# Patient Record
Sex: Female | Born: 1995 | Race: Black or African American | Hispanic: No | Marital: Single | Smoking: Never smoker
Health system: Southern US, Community
[De-identification: ages and names within clinical notes are randomized; demographics above are authoritative.]

## PROBLEM LIST (undated history)

## (undated) DIAGNOSIS — R002 Palpitations: Secondary | ICD-10-CM

## (undated) DIAGNOSIS — J45909 Unspecified asthma, uncomplicated: Secondary | ICD-10-CM

## (undated) DIAGNOSIS — T7840XA Allergy, unspecified, initial encounter: Secondary | ICD-10-CM

## (undated) HISTORY — DX: Unspecified asthma, uncomplicated: J45.909

## (undated) HISTORY — DX: Allergy, unspecified, initial encounter: T78.40XA

---

## 2017-02-02 ENCOUNTER — Encounter: Payer: Self-pay | Admitting: Physician Assistant

## 2017-02-02 ENCOUNTER — Ambulatory Visit (INDEPENDENT_AMBULATORY_CARE_PROVIDER_SITE_OTHER): Payer: Medicaid Other | Admitting: Physician Assistant

## 2017-02-02 VITALS — BP 116/76 | HR 93 | Temp 98.4°F | Ht 64.25 in | Wt 155.0 lb

## 2017-02-02 DIAGNOSIS — Z23 Encounter for immunization: Secondary | ICD-10-CM

## 2017-02-02 DIAGNOSIS — J4530 Mild persistent asthma, uncomplicated: Secondary | ICD-10-CM | POA: Diagnosis not present

## 2017-02-02 MED ORDER — ALBUTEROL SULFATE HFA 108 (90 BASE) MCG/ACT IN AERS: 2.0000 | INHALATION_SPRAY | Freq: Four times a day (QID) | RESPIRATORY_TRACT | 5 refills | Status: DC | PRN

## 2017-02-02 MED ORDER — BECLOMETHASONE DIPROPIONATE 40 MCG/ACT IN AERS: 1.0000 | INHALATION_SPRAY | Freq: Two times a day (BID) | RESPIRATORY_TRACT | 11 refills | Status: DC

## 2017-02-02 MED ORDER — CETIRIZINE HCL 10 MG PO TABS: 10.0000 mg | ORAL_TABLET | Freq: Every day | ORAL | 3 refills | Status: DC

## 2017-02-02 MED ORDER — ALBUTEROL SULFATE (2.5 MG/3ML) 0.083% IN NEBU: 2.5000 mg | INHALATION_SOLUTION | Freq: Four times a day (QID) | RESPIRATORY_TRACT | 5 refills | Status: DC | PRN

## 2017-02-02 NOTE — Patient Instructions (Signed)
It was great meeting you today!  I have sent in refills for your medication. Please let us know if your asthma worsens and make an appointment so we can alter your medications.   Asthma, Adult Asthma is a recurring condition in which the airways tighten and narrow. Asthma can make it difficult to breathe. It can cause coughing, wheezing, and shortness of breath. Asthma episodes, also called asthma attacks, range from minor to life-threatening. Asthma cannot be cured, but medicines and lifestyle changes can help control it. What are the causes? Asthma is believed to be caused by inherited (genetic) and environmental factors, but its exact cause is unknown. Asthma may be triggered by allergens, lung infections, or irritants in the air. Asthma triggers are different for each person. Common triggers include:  Animal dander.  Dust mites.  Cockroaches.  Pollen from trees or grass.  Mold.  Smoke.  Air pollutants such as dust, household cleaners, hair sprays, aerosol sprays, paint fumes, strong chemicals, or strong odors.  Cold air, weather changes, and winds (which increase molds and pollens in the air).  Strong emotional expressions such as crying or laughing hard.  Stress.  Certain medicines (such as aspirin) or types of drugs (such as beta-blockers).  Sulfites in foods and drinks. Foods and drinks that may contain sulfites include dried fruit, potato chips, and sparkling grape juice.  Infections or inflammatory conditions such as the flu, a cold, or an inflammation of the nasal membranes (rhinitis).  Gastroesophageal reflux disease (GERD).  Exercise or strenuous activity. What are the signs or symptoms? Symptoms may occur immediately after asthma is triggered or many hours later. Symptoms include:  Wheezing.  Excessive nighttime or early morning coughing.  Frequent or severe coughing with a common cold.  Chest tightness.  Shortness of breath. How is this diagnosed? The  diagnosis of asthma is made by a review of your medical history and a physical exam. Tests may also be performed. These may include:  Lung function studies. These tests show how much air you breathe in and out.  Allergy tests.  Imaging tests such as X-rays. How is this treated? Asthma cannot be cured, but it can usually be controlled. Treatment involves identifying and avoiding your asthma triggers. It also involves medicines. There are 2 classes of medicine used for asthma treatment:  Controller medicines. These prevent asthma symptoms from occurring. They are usually taken every day.  Reliever or rescue medicines. These quickly relieve asthma symptoms. They are used as needed and provide short-term relief. Your health care provider will help you create an asthma action plan. An asthma action plan is a written plan for managing and treating your asthma attacks. It includes a list of your asthma triggers and how they may be avoided. It also includes information on when medicines should be taken and when their dosage should be changed. An action plan may also involve the use of a device called a peak flow meter. A peak flow meter measures how well the lungs are working. It helps you monitor your condition. Follow these instructions at home:  Take medicines only as directed by your health care provider. Speak with your health care provider if you have questions about how or when to take the medicines.  Use a peak flow meter as directed by your health care provider. Record and keep track of readings.  Understand and use the action plan to help minimize or stop an asthma attack without needing to seek medical care.  Control your home  environment in the following ways to help prevent asthma attacks:  Do not smoke. Avoid being exposed to secondhand smoke.  Change your heating and air conditioning filter regularly.  Limit your use of fireplaces and wood stoves.  Get rid of pests (such as  roaches and mice) and their droppings.  Throw away plants if you see mold on them.  Clean your floors and dust regularly. Use unscented cleaning products.  Try to have someone else vacuum for you regularly. Stay out of rooms while they are being vacuumed and for a short while afterward. If you vacuum, use a dust mask from a hardware store, a double-layered or microfilter vacuum cleaner bag, or a vacuum cleaner with a HEPA filter.  Replace carpet with wood, tile, or vinyl flooring. Carpet can trap dander and dust.  Use allergy-proof pillows, mattress covers, and box spring covers.  Wash bed sheets and blankets every week in hot water and dry them in a dryer.  Use blankets that are made of polyester or cotton.  Clean bathrooms and kitchens with bleach. If possible, have someone repaint the walls in these rooms with mold-resistant paint. Keep out of the rooms that are being cleaned and painted.  Wash hands frequently. Contact a health care provider if:  You have wheezing, shortness of breath, or a cough even if taking medicine to prevent attacks.  The colored mucus you cough up (sputum) is thicker than usual.  Your sputum changes from clear or white to yellow, green, gray, or bloody.  You have any problems that may be related to the medicines you are taking (such as a rash, itching, swelling, or trouble breathing).  You are using a reliever medicine more than 2-3 times per week.  Your peak flow is still at 50-79% of your personal best after following your action plan for 1 hour.  You have a fever. Get help right away if:  You seem to be getting worse and are unresponsive to treatment during an asthma attack.  You are short of breath even at rest.  You get short of breath when doing very little physical activity.  You have difficulty eating, drinking, or talking due to asthma symptoms.  You develop chest pain.  You develop a fast heartbeat.  You have a bluish color to your  lips or fingernails.  You are light-headed, dizzy, or faint.  Your peak flow is less than 50% of your personal best. This information is not intended to replace advice given to you by your health care provider. Make sure you discuss any questions you have with your health care provider.

## 2017-02-02 NOTE — Progress Notes (Signed)
Pre visit review using our clinic review tool, if applicable. No additional management support is needed unless otherwise documented below in the visit note. 

## 2017-02-02 NOTE — Progress Notes (Signed)
   Subjective:    Patient ID: Nancy PigeonMonique Gay, female    DOB: 07-07-1996, 21 y.o.   MRN: [id number]  HPI  Nancy Gay is a 21 y/o female who presents to clinic today to establish care.  Acute Concerns: none  Chronic Issues: Asthma -- last flare-up was last month with activity while working out, she is currently out of her albuterol inhaler so she limits her activity purposefully; her asthma is "worse at night"  Health Maintenance: Immunizations -- did not get the flu shot this year Diet -- variable, just started working on more variety, cut out sodas Exercise -- occasional  Weight -- Weight: 155 lb (70.3 kg) , usually 147lb Mood -- no concerns for depression  Other providers/specialists: None  Review of Systems  See HPI  Past Medical History:  Diagnosis Date  . Allergy    dust mites, animal fur  . Asthma      Social History   Social History  . Marital status: Single    Spouse name: N/A  . Number of children: N/A  . Years of education: N/A   Occupational History  . Not on file.   Social History Main Topics  . Smoking status: Never Smoker  . Smokeless tobacco: Never Used  . Alcohol use No  . Drug use: No  . Sexual activity: No   Other Topics Concern  . Not on file   Social History Narrative   Goes by Nancy Gay   [employer] -- [employer], graduates in 2019   Lives on campus   Hobbies: reading books, watching Netflix and cartoons    History reviewed. No pertinent surgical history.  Family History  Problem Relation Age of Onset  . Hypertension Mother   . Asthma Sister   . Asthma Brother     Allergies  Allergen Reactions  . Amoxicillin Shortness Of Breath  . Penicillins Shortness Of Breath    No current outpatient prescriptions on file prior to visit.   No current facility-administered medications on file prior to visit.     BP 116/76 (BP Location: Left Arm, Patient Position: Sitting, Cuff Size: Normal)   Pulse 93   Temp 98.4 F (36.9  C) (Oral)   Ht 5' 4.25" (1.632 m)   Wt 155 lb (70.3 kg)   LMP 01/27/2017 (Exact Date)   SpO2 98%   BMI 26.40 kg/m      Objective:   Physical Exam  Constitutional: She appears well-developed and well-nourished. She is cooperative.  Non-toxic appearance. She does not have a sickly appearance. She does not appear ill. No distress.  HENT:  Head: Normocephalic and atraumatic.  Cardiovascular: Normal rate, regular rhythm and normal heart sounds.   Pulmonary/Chest: Effort normal and breath sounds normal. No accessory muscle usage. No respiratory distress.  Lymphadenopathy:    She has no cervical adenopathy.  Neurological: She is alert.  Nursing note and vitals reviewed.      Assessment & Plan:  1. Encounter for immunization - Flu Vaccine QUAD 36+ mos IM  2. Mild persistent asthma without complication She is currently limiting her activity secondary to not having an inhaler on hand, she has been on maintenance inhalers in the past, such as QVAR. I have started her back on this, continue daily singulair and albuterol prn. She is to let us know if her asthma symptoms worsen.  Jarold MottoSamantha Pierre Cumpton PA-C 02/02/17

## 2017-07-03 ENCOUNTER — Encounter: Payer: Self-pay | Admitting: Physician Assistant

## 2017-07-03 NOTE — Telephone Encounter (Signed)
Please see message regarding changing Inhaler Qvar to Flovent due to insurance. Please advise.

## 2017-08-02 ENCOUNTER — Encounter: Payer: Self-pay | Admitting: Physician Assistant

## 2017-08-02 ENCOUNTER — Telehealth: Payer: Self-pay | Admitting: Physician Assistant

## 2017-08-02 ENCOUNTER — Other Ambulatory Visit: Payer: Self-pay | Admitting: Physician Assistant

## 2017-08-02 MED ORDER — MONTELUKAST SODIUM 10 MG PO TABS: 10.0000 mg | ORAL_TABLET | Freq: Every day | ORAL | 2 refills | Status: DC

## 2017-08-02 MED ORDER — BECLOMETHASONE DIPROPIONATE 40 MCG/ACT IN AERS: 1.0000 | INHALATION_SPRAY | Freq: Two times a day (BID) | RESPIRATORY_TRACT | 5 refills | Status: DC

## 2017-08-02 NOTE — Telephone Encounter (Signed)
Patient called in reference to Beclomethasone. Patient stated she would like to go ahead and have that sent to pharmacy. Please call patient with any questions.    [facility] [phone] [place] [address] [facility] [address] [phone] (Phone) [phone] (Fax)

## 2017-08-02 NOTE — Telephone Encounter (Signed)
Samantha, okay to refill Singulair?

## 2017-08-02 NOTE — Telephone Encounter (Signed)
Left message on voicemail to call office. Rx for Qvar was sent to pharmacy.

## 2017-08-06 ENCOUNTER — Encounter: Payer: Self-pay | Admitting: Physician Assistant

## 2017-08-06 ENCOUNTER — Other Ambulatory Visit: Payer: Self-pay | Admitting: Physician Assistant

## 2017-08-06 MED ORDER — FLUTICASONE PROPIONATE HFA 110 MCG/ACT IN AERO: 1.0000 | INHALATION_SPRAY | Freq: Two times a day (BID) | RESPIRATORY_TRACT | 3 refills | Status: DC

## 2017-08-30 ENCOUNTER — Encounter (HOSPITAL_COMMUNITY): Payer: Self-pay | Admitting: Emergency Medicine

## 2017-08-30 ENCOUNTER — Emergency Department (HOSPITAL_COMMUNITY)
Admission: EM | Admit: 2017-08-30 | Discharge: 2017-08-30 | Disposition: A | Discharge status: Home / Self Care | Payer: Medicaid Other | Attending: Emergency Medicine | Admitting: Emergency Medicine

## 2017-08-30 ENCOUNTER — Emergency Department (HOSPITAL_COMMUNITY): Payer: Medicaid Other

## 2017-08-30 DIAGNOSIS — J4521 Mild intermittent asthma with (acute) exacerbation: Secondary | ICD-10-CM

## 2017-08-30 DIAGNOSIS — R0602 Shortness of breath: Secondary | ICD-10-CM | POA: Diagnosis present

## 2017-08-30 MED ORDER — ALBUTEROL SULFATE (2.5 MG/3ML) 0.083% IN NEBU
5.0000 mg | INHALATION_SOLUTION | Freq: Once | RESPIRATORY_TRACT | Status: AC
  Administered 2017-08-30: 5 mg via RESPIRATORY_TRACT

## 2017-08-30 MED ORDER — ALBUTEROL SULFATE (2.5 MG/3ML) 0.083% IN NEBU
INHALATION_SOLUTION | RESPIRATORY_TRACT | Status: AC
  Filled 2017-08-30: qty 6

## 2017-08-30 MED ORDER — PREDNISONE 20 MG PO TABS: 40.0000 mg | ORAL_TABLET | Freq: Every day | ORAL | 0 refills | Status: DC

## 2017-08-30 NOTE — ED Notes (Signed)
Pt reports asthma exacerbation last night, attempted inhaler at home with no relief. Mild wheezing noted, RR E/U, no distress.

## 2017-08-30 NOTE — Discharge Instructions (Signed)
1. Medications: albuterol, prednisone, usual home medications °2. Treatment: rest, drink plenty of fluids, begin OTC antihistamine (Zyrtec or Claritin)  °3. Follow Up: Please followup with your primary doctor in 2-3 days for discussion of your diagnoses and further evaluation after today's visit; if you do not have a primary care doctor use the resource guide provided to find one; Please return to the ER for difficulty breathing, high fevers or worsening symptoms. ° °

## 2017-08-30 NOTE — ED Provider Notes (Signed)
[facility]-EMERGENCY DEPT Provider Note   CSN: [id number] Arrival date & time: 08/30/17  0436     History   Chief Complaint Chief Complaint  Patient presents with  . Asthma    HPI Nancy Gay is a 21 y.o. female with a hx of Asthma, allergies presents to the Emergency Department complaining of acute shortness of breath waking her from sleep around 2am with shortness of breath and wheezing. Patient reports she used her rescue inhaler without complete relief, thus prompting her visit to the emergency department 9. Patient reports she primarily uses her rescue inhaler with exercise, proximally 4 times per week.  Patient reports that she is taking Zyrtec, Flovent and Singulair daily. She reports she missed her Zyrtec and Singulair this evening. Patient states she does not regularly wake at night with wheezing or shortness of breath. She reports her last course of steroids was December 2017. She reports it has been several years since she was hospitalized and has never been intubated. Patient denies fevers or chills, nausea or vomiting, abdominal pain, URI symptoms. She denies sick contacts.  Patient reports she was given a nebulizer arrival in the emergency department and feels much better at this time.  The history is provided by the patient and medical records. No language interpreter was used.    Past Medical History:  Diagnosis Date  . Allergy    dust mites, animal fur  . Asthma     There are no active problems to display for this patient.   History reviewed. No pertinent surgical history.  OB History    No data available       Home Medications    Prior to Admission medications   Medication Sig Start Date End Date Taking? Authorizing Provider  albuterol (PROVENTIL HFA;VENTOLIN HFA) 108 (90 Base) MCG/ACT inhaler Inhale 2 puffs into the lungs every 6 (six) hours as needed for wheezing or shortness of breath. 02/02/17   Jarold MottoWorley, Samantha, PA  albuterol (PROVENTIL) (2.5 MG/3ML)  0.083% nebulizer solution Take 3 mLs (2.5 mg total) by nebulization every 6 (six) hours as needed for wheezing or shortness of breath. 02/02/17   Jarold MottoWorley, Samantha, PA  cetirizine (ZYRTEC) 10 MG tablet Take 1 tablet (10 mg total) by mouth daily. 02/02/17   Jarold MottoWorley, Samantha, PA  fluticasone (FLOVENT HFA) 110 MCG/ACT inhaler Inhale 1 puff into the lungs 2 (two) times daily. 08/06/17   Jarold MottoWorley, Samantha, PA  montelukast (SINGULAIR) 10 MG tablet TAKE 1 TABLET(10 MG) BY MOUTH AT BEDTIME 08/02/17   Jarold MottoWorley, Samantha, PA  predniSONE (DELTASONE) 20 MG tablet Take 2 tablets (40 mg total) by mouth daily. 08/30/17   Arien Benincasa, Dahlia ClientHannah, PA-C    Family History Family History  Problem Relation Age of Onset  . Hypertension Mother   . Asthma Sister   . Asthma Brother     Social History Social History  Substance Use Topics  . Smoking status: Never Smoker  . Smokeless tobacco: Never Used  . Alcohol use No     Allergies   Amoxicillin and Penicillins   Review of Systems Review of Systems  Constitutional: Negative for appetite change, diaphoresis, fatigue, fever and unexpected weight change.  HENT: Negative for mouth sores.   Eyes: Negative for visual disturbance.  Respiratory: Positive for chest tightness, shortness of breath and wheezing. Negative for cough.   Cardiovascular: Negative for chest pain.  Gastrointestinal: Negative for abdominal pain, constipation, diarrhea, nausea and vomiting.  Endocrine: Negative for polydipsia, polyphagia and polyuria.  Genitourinary: Negative for dysuria,  frequency, hematuria and urgency.  Musculoskeletal: Negative for back pain and neck stiffness.  Skin: Negative for rash.  Allergic/Immunologic: Negative for immunocompromised state.  Neurological: Negative for syncope, light-headedness and headaches.  Hematological: Does not bruise/bleed easily.  Psychiatric/Behavioral: Negative for sleep disturbance. The patient is not nervous/anxious.   All other systems reviewed  and are negative.    Physical Exam Updated Vital Signs BP 126/70 (BP Location: Right Arm)   Pulse 82   Temp 98.4 F (36.9 C)   Resp 16   Ht  (1.626 m)   Wt 70.3 kg (155 lb)   LMP 08/30/2017 (Exact Date)   SpO2 100%   BMI 26.61 kg/m   Physical Exam  Constitutional: She appears well-developed and well-nourished. No distress.  Awake, alert, nontoxic appearance  HENT:  Head: Normocephalic and atraumatic.  Mouth/Throat: Oropharynx is clear and moist. No oropharyngeal exudate.  Eyes: Conjunctivae are normal. No scleral icterus.  Neck: Normal range of motion. Neck supple.  Cardiovascular: Normal rate, regular rhythm and intact distal pulses.   Pulmonary/Chest: Effort normal and breath sounds normal. No respiratory distress. She has no wheezes.  Equal chest expansion Clear and equal breath sounds  Abdominal: Soft. Bowel sounds are normal. She exhibits no mass. There is no tenderness. There is no rebound and no guarding.  Musculoskeletal: Normal range of motion. She exhibits no edema.  Neurological: She is alert.  Speech is clear and goal oriented Moves extremities without ataxia  Skin: Skin is warm and dry. She is not diaphoretic.  Psychiatric: She has a normal mood and affect.  Nursing note and vitals reviewed.    ED Treatments / Results   Radiology Dg Chest 2 View  Result Date: 08/30/2017 CLINICAL DATA:  21 y/o  F; asthma. EXAM: CHEST  2 VIEW COMPARISON:  None. FINDINGS: The heart size and mediastinal contours are within normal limits. Both lungs are clear. Mild dextrocurvature of the lower thoracic spine. IMPRESSION: No active cardiopulmonary disease. Electronically Signed   By: Mitzi Hansen M.D.   On: 08/30/2017 05:24    Procedures Procedures (including critical care time)  Medications Ordered in ED Medications  albuterol (PROVENTIL) (2.5 MG/3ML) 0.083% nebulizer solution (not administered)  albuterol (PROVENTIL) (2.5 MG/3ML) 0.083% nebulizer  solution 5 mg (5 mg Nebulization Given 08/30/17 0452)     Initial Impression / Assessment and Plan / ED Course  I have reviewed the triage vital signs and the nursing notes.  Pertinent labs & imaging results that were available during my care of the patient were reviewed by me and considered in my medical decision making (see chart for details).     Upon my assessment, after nebulizer in triage, patient was clear and equal breath sounds. She reports she is breathing at baseline. Patient with O2 saturations maintained >90, no hypoxia in the emergency department, no current signs of respiratory distress. Prednisone given in the ED and pt will be discharged with 5 day burst. Pt states they are breathing at baseline. Pt has been instructed to continue using prescribed medications and to speak with PCP about today's exacerbation.    Final Clinical Impressions(s) / ED Diagnoses   Final diagnoses:  Mild intermittent asthma with exacerbation    New Prescriptions New Prescriptions   PREDNISONE (DELTASONE) 20 MG TABLET    Take 2 tablets (40 mg total) by mouth daily.     Lyrick Worland, Dahlia Client, Cordelia Poche 08/30/17 1610    Alvira Monday, MD 09/02/17 1539

## 2017-11-29 ENCOUNTER — Other Ambulatory Visit: Payer: Self-pay | Admitting: Physician Assistant

## 2018-03-07 ENCOUNTER — Other Ambulatory Visit: Payer: Self-pay | Admitting: Physician Assistant

## 2018-03-22 ENCOUNTER — Other Ambulatory Visit: Payer: Self-pay | Admitting: Family Medicine

## 2018-03-25 ENCOUNTER — Other Ambulatory Visit: Payer: Self-pay | Admitting: Physician Assistant

## 2018-03-29 ENCOUNTER — Ambulatory Visit: Payer: Medicaid Other | Admitting: Physician Assistant

## 2018-04-15 ENCOUNTER — Other Ambulatory Visit: Payer: Self-pay | Admitting: Physician Assistant

## 2018-05-02 ENCOUNTER — Encounter: Payer: Self-pay | Admitting: Physician Assistant

## 2018-05-02 ENCOUNTER — Ambulatory Visit (INDEPENDENT_AMBULATORY_CARE_PROVIDER_SITE_OTHER): Payer: Medicaid Other | Admitting: Physician Assistant

## 2018-05-02 ENCOUNTER — Other Ambulatory Visit: Payer: Self-pay | Admitting: Physician Assistant

## 2018-05-02 VITALS — BP 130/80 | HR 84 | Temp 98.2°F | Ht 64.0 in | Wt 148.5 lb

## 2018-05-02 DIAGNOSIS — J4540 Moderate persistent asthma, uncomplicated: Secondary | ICD-10-CM

## 2018-05-02 DIAGNOSIS — J301 Allergic rhinitis due to pollen: Secondary | ICD-10-CM

## 2018-05-02 DIAGNOSIS — R21 Rash and other nonspecific skin eruption: Secondary | ICD-10-CM

## 2018-05-02 MED ORDER — PREDNISONE 20 MG PO TABS: 20.0000 mg | ORAL_TABLET | Freq: Every day | ORAL | 0 refills | Status: DC

## 2018-05-02 MED ORDER — TRIAMCINOLONE ACETONIDE 0.5 % EX OINT: 1.0000 "application " | TOPICAL_OINTMENT | Freq: Two times a day (BID) | CUTANEOUS | 0 refills | Status: DC

## 2018-05-02 MED ORDER — ALBUTEROL SULFATE HFA 108 (90 BASE) MCG/ACT IN AERS: 2.0000 | INHALATION_SPRAY | Freq: Four times a day (QID) | RESPIRATORY_TRACT | 5 refills | Status: DC | PRN

## 2018-05-02 MED ORDER — MONTELUKAST SODIUM 10 MG PO TABS: ORAL_TABLET | ORAL | 1 refills | Status: DC

## 2018-05-02 NOTE — Progress Notes (Signed)
Nancy Gay is a 22 y.o. female here for a recurrence of a previously resolved problem.  I acted as a Neurosurgeon for [employer], PA-C Corky Mull, LPN  History of Present Illness:   Chief Complaint  Patient presents with  . Asthma    Asthma  She complains of cough, hoarse voice, shortness of breath and sputum production. There is no difficulty breathing or wheezing. This is a recurrent problem. The current episode started in the past 7 days. The problem occurs constantly. The problem has been gradually worsening. The cough is productive of sputum. Associated symptoms include nasal congestion, postnasal drip and sneezing. Pertinent negatives include no sore throat or trouble swallowing. Her symptoms are aggravated by change in weather, pollen and lying down. Relieved by: Using Flovent Inhaler twice a day, did Nebulizer Rx x 1. She reports significant improvement on treatment. Her past medical history is significant for asthma.   She is currently out of her Albuterol Inhaler and Singulair.  Skin Lesion She has a small rash that appeared a few days ago on her upper L arm. Does not itch. Denies contacts with similar rash. Does have possible hx of eczema per her report. She has not tried anything for her symptoms.  Past Medical History:  Diagnosis Date  . Allergy    dust mites, animal fur  . Asthma      Social History   Socioeconomic History  . Marital status: Single    Spouse name: Not on file  . Number of children: Not on file  . Years of education: Not on file  . Highest education level: Not on file  Occupational History  . Not on file  Social Needs  . Financial resource strain: Not on file  . Food insecurity:    Worry: Not on file    Inability: Not on file  . Transportation needs:    Medical: Not on file    Non-medical: Not on file  Tobacco Use  . Smoking status: Never Smoker  . Smokeless tobacco: Never Used  Substance and Sexual Activity  . Alcohol use: No   . Drug use: No  . Sexual activity: Never  Lifestyle  . Physical activity:    Days per week: Not on file    Minutes per session: Not on file  . Stress: Not on file  Relationships  . Social connections:    Talks on phone: Not on file    Gets together: Not on file    Attends religious service: Not on file    Active member of club or organization: Not on file    Attends meetings of clubs or organizations: Not on file    Relationship status: Not on file  . Intimate partner violence:    Fear of current or ex partner: Not on file    Emotionally abused: Not on file    Physically abused: Not on file    Forced sexual activity: Not on file  Other Topics Concern  . Not on file  Social History Narrative   [name] -- [employer], graduates in 2019   Lives on campus   Hobbies: reading books, watching Netflix and cartoons    History reviewed. No pertinent surgical history.  Family History  Problem Relation Age of Onset  . Hypertension Mother   . Asthma Sister   . Asthma Brother     Allergies  Allergen Reactions  . Amoxicillin Shortness Of Breath  . Penicillins Shortness Of Breath  Current Medications:   Current Outpatient Medications:  .  albuterol (PROVENTIL) (2.5 MG/3ML) 0.083% nebulizer solution, Take 3 mLs (2.5 mg total) by nebulization every 6 (six) hours as needed for wheezing or shortness of breath., Disp: 75 mL, Rfl: 5 .  cetirizine (ZYRTEC) 10 MG tablet, TAKE 1 TABLET(10 MG) BY MOUTH DAILY, Disp: 90 tablet, Rfl: 0 .  fluticasone (FLOVENT HFA) 110 MCG/ACT inhaler, Inhale 1 puff into the lungs 2 (two) times daily., Disp: 1 Inhaler, Rfl: 3 .  albuterol (PROVENTIL HFA;VENTOLIN HFA) 108 (90 Base) MCG/ACT inhaler, Inhale 2 puffs into the lungs every 6 (six) hours as needed for wheezing or shortness of breath., Disp: 1 Inhaler, Rfl: 5 .  montelukast (SINGULAIR) 10 MG tablet, TAKE 1 TABLET(10 MG) BY MOUTH AT BEDTIME, Disp: 90 tablet, Rfl: 1 .   predniSONE (DELTASONE) 20 MG tablet, Take 1 tablet (20 mg total) by mouth daily with breakfast., Disp: 5 tablet, Rfl: 0 .  triamcinolone ointment (KENALOG) 0.5 %, Apply 1 application topically 2 (two) times daily., Disp: 30 g, Rfl: 0   Review of Systems:   Review of Systems  HENT: Positive for hoarse voice, postnasal drip and sneezing. Negative for sore throat and trouble swallowing.   Respiratory: Positive for cough, sputum production and shortness of breath. Negative for wheezing.     Vitals:   Vitals:   05/02/18 1104  BP: 130/80  Pulse: 84  Temp: 98.2 F (36.8 C)  TempSrc: Oral  SpO2: 100%  Weight: 148 lb 8 oz (67.4 kg)  Height:  (1.626 m)     Body mass index is 25.49 kg/m.  Physical Exam:   Physical Exam  Constitutional: She appears well-developed. She is cooperative.  Non-toxic appearance. She does not have a sickly appearance. She does not appear ill. No distress.  HENT:  Head: Normocephalic and atraumatic.  Right Ear: Tympanic membrane, external ear and ear canal normal. Tympanic membrane is not erythematous, not retracted and not bulging.  Left Ear: Tympanic membrane, external ear and ear canal normal. Tympanic membrane is not erythematous, not retracted and not bulging.  Nose: Mucosal edema and rhinorrhea present. Right sinus exhibits no maxillary sinus tenderness and no frontal sinus tenderness. Left sinus exhibits no maxillary sinus tenderness and no frontal sinus tenderness.  Mouth/Throat: Uvula is midline and mucous membranes are normal. No posterior oropharyngeal edema or posterior oropharyngeal erythema.  Eyes: Conjunctivae and lids are normal.  Neck: Trachea normal.  Cardiovascular: Normal rate, regular rhythm, S1 normal, S2 normal and normal heart sounds.  Pulmonary/Chest: Effort normal and breath sounds normal. She has no decreased breath sounds. She has no wheezes. She has no rhonchi. She has no rales.  Good lung sounds bilaterally, clear to  auscultation  Lymphadenopathy:    She has no cervical adenopathy.  Neurological: She is alert.  Skin: Skin is warm, dry and intact.  1 cm round area of elevated papules to lateral upper R arm, not erythematous, not tender, no drainage or swelling  Psychiatric: She has a normal mood and affect. Her speech is normal and behavior is normal.  Nursing note and vitals reviewed.   Assessment and Plan:    Ekaterini was seen today for asthma.  Diagnoses and all orders for this visit:  Moderate persistent asthma, unspecified whether complicated; Seasonal allergic rhinitis due to pollen No red flags on exam. Continue Zyrtec and Flovent. Start prn Albuterol. Resume Singulair. Start 20 mg prednisone x 5 days. Discussed with patient that if she is requiring Albuterol  more than 2-3 times daily after finishing the steroid, please come back and see [place].  Rash Unsure etiology. Does have hx of eczema, provided a prescription for triamcinolone.  If this does not improve or makes sx worse, I recommended she purchase an over-the-counter antifungal cream.  Follow-up if symptoms persist despite treatments.   Other orders -     albuterol (PROVENTIL HFA;VENTOLIN HFA) 108 (90 Base) MCG/ACT inhaler; Inhale 2 puffs into the lungs every 6 (six) hours as needed for wheezing or shortness of breath. -     montelukast (SINGULAIR) 10 MG tablet; TAKE 1 TABLET(10 MG) BY MOUTH AT BEDTIME -     triamcinolone ointment (KENALOG) 0.5 %; Apply 1 application topically 2 (two) times daily. -     predniSONE (DELTASONE) 20 MG tablet; Take 1 tablet (20 mg total) by mouth daily with breakfast.    . Reviewed expectations re: course of current medical issues. . Discussed self-management of symptoms. . Outlined signs and symptoms indicating need for more acute intervention. . Patient verbalized understanding and all questions were answered. . See orders for this visit as documented in the electronic medical record. . Patient received  an After-Visit Summary.  CMA or LPN served as scribe during this visit. History, Physical, and Plan performed by medical provider. Documentation and orders reviewed and attested to.  Jarold Motto, PA-C

## 2018-05-02 NOTE — Patient Instructions (Addendum)
It was great to see you!  For asthma: Continue Zyrtec and Flovent daily. Resume Singulair daily. Resume Albuterol as needed -- if you are requiring more than 2-3 times daily after finishing the steroid, please come back and see me. Start oral steroid, take daily x 5 days.  For skin: Use triamcinolone ointment, apply to area 1-2 x daily. I have sent this in for you. It if is too expensive, you can try an over the counter hydrocortisone cream. (The pharmacist can help you find this) If this makes it worse, stop using and try an over the counter anti-fungal cream (such as clotrimazole). Follow-up if symptoms persist.

## 2018-05-03 ENCOUNTER — Encounter: Payer: Self-pay | Admitting: Physician Assistant

## 2018-08-19 ENCOUNTER — Other Ambulatory Visit: Payer: Self-pay | Admitting: Physician Assistant

## 2018-09-08 ENCOUNTER — Other Ambulatory Visit: Payer: Self-pay | Admitting: Physician Assistant

## 2018-10-02 ENCOUNTER — Other Ambulatory Visit: Payer: Self-pay

## 2018-10-02 ENCOUNTER — Encounter (HOSPITAL_COMMUNITY): Payer: Self-pay

## 2018-10-02 ENCOUNTER — Emergency Department (HOSPITAL_COMMUNITY)
Admission: EM | Admit: 2018-10-02 | Discharge: 2018-10-02 | Disposition: A | Discharge status: Home / Self Care | Payer: Medicaid Other | Attending: Emergency Medicine | Admitting: Emergency Medicine

## 2018-10-02 DIAGNOSIS — R7989 Other specified abnormal findings of blood chemistry: Secondary | ICD-10-CM | POA: Diagnosis not present

## 2018-10-02 DIAGNOSIS — R002 Palpitations: Secondary | ICD-10-CM | POA: Diagnosis not present

## 2018-10-02 DIAGNOSIS — Z79899 Other long term (current) drug therapy: Secondary | ICD-10-CM | POA: Diagnosis not present

## 2018-10-02 DIAGNOSIS — J45909 Unspecified asthma, uncomplicated: Secondary | ICD-10-CM | POA: Insufficient documentation

## 2018-10-02 DIAGNOSIS — R0602 Shortness of breath: Secondary | ICD-10-CM | POA: Diagnosis present

## 2018-10-02 LAB — RAPID URINE DRUG SCREEN, HOSP PERFORMED
AMPHETAMINES: NOT DETECTED
BARBITURATES: NOT DETECTED
BENZODIAZEPINES: NOT DETECTED
Cocaine: NOT DETECTED
Opiates: NOT DETECTED
Tetrahydrocannabinol: NOT DETECTED

## 2018-10-02 LAB — CBC WITH DIFFERENTIAL/PLATELET
ABS IMMATURE GRANULOCYTES: 0.02 10*3/uL (ref 0.00–0.07)
BASOS ABS: 0 10*3/uL (ref 0.0–0.1)
Basophils Relative: 1 %
EOS PCT: 4 %
Eosinophils Absolute: 0.3 10*3/uL (ref 0.0–0.5)
HCT: 42.1 % (ref 36.0–46.0)
HEMOGLOBIN: 13.5 g/dL (ref 12.0–15.0)
Immature Granulocytes: 0 %
LYMPHS ABS: 1.8 10*3/uL (ref 0.7–4.0)
LYMPHS PCT: 26 %
MCH: 28.4 pg (ref 26.0–34.0)
MCHC: 32.1 g/dL (ref 30.0–36.0)
MCV: 88.4 fL (ref 80.0–100.0)
Monocytes Absolute: 0.5 10*3/uL (ref 0.1–1.0)
Monocytes Relative: 7 %
NEUTROS PCT: 62 %
NRBC: 0 % (ref 0.0–0.2)
Neutro Abs: 4.4 10*3/uL (ref 1.7–7.7)
Platelets: 235 10*3/uL (ref 150–400)
RBC: 4.76 MIL/uL (ref 3.87–5.11)
RDW: 12.1 % (ref 11.5–15.5)
WBC: 7.1 10*3/uL (ref 4.0–10.5)

## 2018-10-02 LAB — BASIC METABOLIC PANEL
Anion gap: 13 (ref 5–15)
BUN: 14 mg/dL (ref 6–20)
CHLORIDE: 105 mmol/L (ref 98–111)
CO2: 23 mmol/L (ref 22–32)
Calcium: 10.1 mg/dL (ref 8.9–10.3)
Creatinine, Ser: 1.01 mg/dL — ABNORMAL HIGH (ref 0.44–1.00)
GFR calc non Af Amer: >60 mL/min (ref >60)
Glucose, Bld: 152 mg/dL — ABNORMAL HIGH (ref 70–99)
POTASSIUM: 3.4 mmol/L — AB (ref 3.5–5.1)
Sodium: 141 mmol/L (ref 135–145)

## 2018-10-02 LAB — CBG MONITORING, ED: Glucose-Capillary: 145 mg/dL — ABNORMAL HIGH (ref 70–99)

## 2018-10-02 LAB — MAGNESIUM: MAGNESIUM: 2.2 mg/dL (ref 1.7–2.4)

## 2018-10-02 LAB — I-STAT TROPONIN, ED: Troponin i, poc: 0 ng/mL (ref 0.00–0.08)

## 2018-10-02 LAB — POC URINE PREG, ED: PREG TEST UR: NEGATIVE

## 2018-10-02 LAB — TSH: TSH: 7.832 u[IU]/mL — ABNORMAL HIGH (ref 0.350–4.500)

## 2018-10-02 MED ORDER — LORAZEPAM 0.5 MG PO TABS
0.5000 mg | ORAL_TABLET | Freq: Once | ORAL | Status: AC
  Administered 2018-10-02: 0.5 mg via ORAL
  Filled 2018-10-02: qty 1

## 2018-10-02 NOTE — ED Provider Notes (Signed)
[facility]-EMERGENCY DEPT Provider Note   CSN: [id number] Arrival date & time: 10/02/18  2002     History   Chief Complaint Chief Complaint  Patient presents with  . Tachycardia  . Shortness of Breath    HPI Nancy Gay is a 22 y.o. female with history of asthma is here for evaluation of shortness of breath onset 8 pm tonight.  Sudden, described as "hyperventilating", heavy breathing.  Associated with palpitations, heart "pumping" out of her chest, tremors and shakiness to her hands and legs.  Patient states she had been standing up for a long period of time doing her hair when she suddenly felt odd, she began hyperventilating and breathing hard, so she took 2 pumps of her albuterol and Flovent inhalers at home.  Her breathing improved but she developed generalized shaking, tremors and palpitations soon after using her inhalers.  States the tremors in her hands go away when she uses her phone or uses her hands.  Notes that she has had recent increased stress from school work and slight anxiety.  3 to 4 hours prior to symptom onset she had a large brownie and a large coffee but nothing else to eat.    She denies chest pain, cough, headache, vision changes, nausea, vomiting, diaphoresis, abdominal pain, lightheadedness.  Denies illicit drug use.  Denies cigarette use.  Denies EtOH use. No estrogen use. No recent prolonged travel, surgery. No calf pain or swelling.   HPI  Past Medical History:  Diagnosis Date  . Allergy    dust mites, animal fur  . Asthma     There are no active problems to display for this patient.   History reviewed. No pertinent surgical history.   OB History   None      Home Medications    Prior to Admission medications   Medication Sig Start Date End Date Taking? Authorizing Provider  albuterol (PROVENTIL HFA;VENTOLIN HFA) 108 (90 Base) MCG/ACT inhaler Inhale 2 puffs into the lungs every 6 (six) hours as needed for wheezing or  shortness of breath. 05/02/18  Yes Worley, Lelon Mast, PA  cetirizine (ZYRTEC) 10 MG tablet TAKE 1 TABLET BY MOUTH DAILY 09/09/18  Yes Shelva Majestic, MD  FLOVENT HFA 110 MCG/ACT inhaler INHALE 1 PUFF INTO THE LUNGS TWICE DAILY 08/19/18  Yes Bufford Buttner, [place], PA  montelukast (SINGULAIR) 10 MG tablet TAKE 1 TABLET(10 MG) BY MOUTH AT BEDTIME 05/02/18  Yes Jarold Motto, PA  Multiple Vitamin (MULTIVITAMIN WITH MINERALS) TABS tablet Take 1 tablet by mouth daily.   Yes [provider]  Phenylephrine-Acetaminophen (TYLENOL SINUS+HEADACHE PO) Take 2 tablets by mouth daily as needed (headache).   Yes [provider]  triamcinolone ointment (KENALOG) 0.5 % Apply 1 application topically 2 (two) times daily. 05/02/18  Yes Worley, Lelon Mast, PA  albuterol (PROVENTIL) (2.5 MG/3ML) 0.083% nebulizer solution TAKE 3 MLS BY NEBULIZATION EVERY 6 HOURS AS NEEDED FOR WHEEZING OR SHORTNESS OF BREATH Patient not taking: Reported on 10/02/2018 05/02/18   Jarold Motto, PA  predniSONE (DELTASONE) 20 MG tablet Take 1 tablet (20 mg total) by mouth daily with breakfast. Patient not taking: Reported on 10/02/2018 05/02/18   Jarold Motto, PA    Family History Family History  Problem Relation Age of Onset  . Hypertension Mother   . Asthma Sister   . Asthma Brother     Social History Social History   Tobacco Use  . Smoking status: Never Smoker  . Smokeless tobacco: Never Used  Substance Use  Topics  . Alcohol use: No  . Drug use: No     Allergies   Amoxicillin and Penicillins   Review of Systems Review of Systems  Respiratory: Positive for shortness of breath.   Cardiovascular: Positive for palpitations.  Neurological: Positive for tremors.  All other systems reviewed and are negative.    Physical Exam Updated Vital Signs BP (!) 141/87   Pulse 84   Temp 97.6 F (36.4 C)   Resp 20   Ht 5\' 4"  (1.626 m)   Wt 68.9 kg   SpO2 100%   BMI 26.09 kg/m   Physical Exam  Constitutional:  She is oriented to person, place, and time. She appears well-developed and well-nourished.  Appears anxious.   HENT:  Head: Normocephalic and atraumatic.  Right Ear: External ear normal.  Left Ear: External ear normal.  Nose: Nose normal.  Eyes: Conjunctivae and EOM are normal. No scleral icterus.  Neck: Normal range of motion. Neck supple.  Cardiovascular: Regular rhythm and normal heart sounds.  HR 100-105. RRR. 1+ radial and DP pulses bilaterally. No calf swelling or tenderness.   Pulmonary/Chest: Effort normal and breath sounds normal.  Abdominal: Soft. There is no tenderness.  Musculoskeletal: Normal range of motion. She exhibits no deformity.  Neurological: She is alert and oriented to person, place, and time.  Slight tremors to hands and bilateral legs, this stops when pt is actively moving, using her phone, lifting legs off bed. Strength 5/5 with hand grip and ankle F/E.   Sensation to light touch intact in hands and feet. Normal gait.  CN I and VIII not tested. CN II-XII grossly intact bilaterally.   Skin: Skin is warm and dry. Capillary refill takes less than 2 seconds.  Psychiatric: Her behavior is normal. Judgment and thought content normal. Her mood appears anxious.  Appears anxious, taking deep breaths.  Fingernail biting.   Nursing note and vitals reviewed.    ED Treatments / Results  Labs (all labs ordered are listed, but only abnormal results are displayed) Labs Reviewed  BASIC METABOLIC PANEL - Abnormal; Notable for the following components:      Result Value   Potassium 3.4 (*)    Glucose, Bld 152 (*)    Creatinine, Ser 1.01 (*)    All other components within normal limits  TSH - Abnormal; Notable for the following components:   TSH 7.832 (*)    All other components within normal limits  CBG MONITORING, ED - Abnormal; Notable for the following components:   Glucose-Capillary 145 (*)    All other components within normal limits  CBC WITH  DIFFERENTIAL/PLATELET  RAPID URINE DRUG SCREEN, HOSP PERFORMED  MAGNESIUM  T4, FREE  I-STAT TROPONIN, ED  POC URINE PREG, ED    EKG EKG Interpretation  Date/Time:  Wednesday October 02 2018 22:29:04 EDT Ventricular Rate:  69 PR Interval:    QRS Duration: 106 QT Interval:  411 QTC Calculation: 441 R Axis:   49 Text Interpretation:  Sinus rhythm Artifact in lead(s) I II aVR aVL aVF V1 V2 Since last tracing rate faster Confirmed by Jacalyn Lefevre [phone]) on 10/02/2018 11:39:59 PM   Radiology No results found.  Procedures Procedures (including critical care time)  Medications Ordered in ED Medications  LORazepam (ATIVAN) tablet 0.5 mg (0.5 mg Oral Given 10/02/18 2135)     Initial Impression / Assessment and Plan / ED Course  I have reviewed the triage vital signs and the nursing notes.  Pertinent labs & imaging  results that were available during my care of the patient were reviewed by me and considered in my medical decision making (see chart for details).  Clinical Course as of Oct 02 2342  Wed Oct 02, 2018  2234 TSH(!): 7.832 [CG]    Clinical Course User Index [CG] Liberty Handy, PA-C    22 year old here with palpitations and tremors.  Associated with brief, resolved hyperventilation, SOB.    Ddx includes recent caffeine intake versus illicit drug use versus hyperthyroidism versus arrhythmia.  Anxiety and stress may be contributing as well.  Albuterol/flovent could cause slight palpitations and tremors, however states she has used this for a long time w/o similar previous sxs.   2330: Hgb normal.  Pregnancy negative. UDS clean. Electrolytes WNL. EKG with sinus tachycardia, repeat with NSR. Symptoms completely resolved with ativan. TSH 7.832 which does not fit hyperthyroidism picture, pending FT4. Favoring caffeine intake vs stress vs inhaler side effect.  She has no symptoms of hypothyroidism.  Considered PE given tachycardia and shortness of breath however other  than initial tachycardia, she has no risk factors for this, has no chest pain, and her SOB resolved upon arrival to ER.  I discussed with patient the likelihood of a PE is unlikely given symptomatology.  I do not think further work-up for DVT/PE, will defer d-dimer/CTA.  I explained this to patient who is comfortable with this.  Will dc with recommendation to f/u with PCP or cardiology for re-check of palpitations and TSH/FT4 levels. Discussed return precautions. Pt is in agreement. Pt discussed with Dr. Particia Nearing.    Final Clinical Impressions(s) / ED Diagnoses   Final diagnoses:  Palpitations  Elevated TSH    ED Discharge Orders    None       Jerrell Mylar 10/02/18 2343    Jacalyn Lefevre, MD 10/03/18 0000

## 2018-10-02 NOTE — ED Triage Notes (Signed)
Pt reports sudden SOB and tachycardia starting about 20 minutes PTA. Sh also endorses tremors. Hx asthma. She reports that she took her inhaler without relief. A&Ox4.

## 2018-10-02 NOTE — Discharge Instructions (Addendum)
You were seen in the ER for palpitations.    Your thyroid stimulating hormone was slightly elevated (7.832).  Your free T4 level as still pending.  However all other labs were normal.    The cause of your symptoms is unclear.  You need to follow up with cardiology or primary care doctor within 7-10 days for re-evaluation and recheck blood work.  Return to the ER for  You have chest pain. You feel short of breath. You have a very bad headache. You feel dizzy. You pass out (faint).

## 2018-10-03 LAB — T4, FREE: FREE T4: 0.72 ng/dL — AB (ref 0.82–1.77)

## 2018-10-07 ENCOUNTER — Ambulatory Visit: Payer: Medicaid Other | Admitting: Physician Assistant

## 2018-10-08 ENCOUNTER — Encounter: Payer: Self-pay | Admitting: Physician Assistant

## 2018-10-08 ENCOUNTER — Ambulatory Visit (INDEPENDENT_AMBULATORY_CARE_PROVIDER_SITE_OTHER): Payer: Medicaid Other | Admitting: Physician Assistant

## 2018-10-08 VITALS — BP 116/78 | HR 103 | Temp 97.9°F | Ht 64.0 in | Wt 148.2 lb

## 2018-10-08 DIAGNOSIS — R7989 Other specified abnormal findings of blood chemistry: Secondary | ICD-10-CM

## 2018-10-08 DIAGNOSIS — R7309 Other abnormal glucose: Secondary | ICD-10-CM

## 2018-10-08 DIAGNOSIS — R002 Palpitations: Secondary | ICD-10-CM

## 2018-10-08 DIAGNOSIS — E876 Hypokalemia: Secondary | ICD-10-CM | POA: Diagnosis not present

## 2018-10-08 DIAGNOSIS — F419 Anxiety disorder, unspecified: Secondary | ICD-10-CM | POA: Diagnosis not present

## 2018-10-08 DIAGNOSIS — R000 Tachycardia, unspecified: Secondary | ICD-10-CM

## 2018-10-08 LAB — BASIC METABOLIC PANEL
BUN: 13 mg/dL (ref 6–23)
CALCIUM: 10.6 mg/dL — AB (ref 8.4–10.5)
CHLORIDE: 102 meq/L (ref 96–112)
CO2: 27 meq/L (ref 19–32)
CREATININE: 0.8 mg/dL (ref 0.40–1.20)
GFR: 117.01 mL/min (ref >60.00)
GLUCOSE: 89 mg/dL (ref 70–99)
POTASSIUM: 3.8 meq/L (ref 3.5–5.1)
SODIUM: 139 meq/L (ref 135–145)

## 2018-10-08 LAB — TSH: TSH: 5.82 u[IU]/mL — AB (ref 0.35–5.50)

## 2018-10-08 LAB — T4, FREE: Free T4: 0.84 ng/dL (ref 0.60–1.60)

## 2018-10-08 LAB — HEMOGLOBIN A1C: HEMOGLOBIN A1C: 4.6 % (ref 4.6–6.5)

## 2018-10-08 MED ORDER — HYDROXYZINE HCL 25 MG PO TABS: ORAL_TABLET | ORAL | 0 refills | Status: DC

## 2018-10-08 NOTE — Progress Notes (Signed)
Nancy Gay is a 22 y.o. female here for a follow up of a pre-existing problem.  History of Present Illness:   Chief Complaint  Patient presents with  . Follow-up    seen on 10/9 for anxiety.    HPI   Went to the ED on 10/02/18 for SOB, sensation of "hyperventilating" and heavy breathing. She ate a normal lunch around noon, and then around 4p had a large venti iced coffee and chocolate brownie, and around 7p that night she started to "feel shaky" and weird, heart was racing, tried to eat pizza, tried to get fresh air, tried a puff of albuterol but did not get relief so went to ER.  Two EKGs were performed -- and she was told that they were normal. Labs in ER reviewed, revealed slightly low potassium of 3.4. Elevated glucose. Elevated TSH of 7.8 and low free T4 of 0.72. She was told she may be having a panic attack and to follow-up with PCP vs cardiology.  Since she left the ER she had one more episode and it was on Sunday -- started to have palpitations but was able to get it under control with heavy breathing, was in a car when this happened. She is nervous that this may happen while she in class.  Denies chest pain, nausea, change in appetite or other concerns.  GAD 7 : Generalized Anxiety Score 10/08/2018  Nervous, Anxious, on Edge 2  Control/stop worrying 2  Worry too much - different things 3  Trouble relaxing 3  Restless 1  Easily annoyed or irritable 0  Afraid - awful might happen 3  Total GAD 7 Score 14  Anxiety Difficulty Somewhat difficult      Past Medical History:  Diagnosis Date  . Allergy    dust mites, animal fur  . Asthma      Social History   Socioeconomic History  . Marital status: Single    Spouse name: Not on file  . Number of children: Not on file  . Years of education: Not on file  . Highest education level: Not on file  Occupational History  . Not on file  Social Needs  . Financial resource strain: Not on file  . Food insecurity:   Worry: Not on file    Inability: Not on file  . Transportation needs:    Medical: Not on file    Non-medical: Not on file  Tobacco Use  . Smoking status: Never Smoker  . Smokeless tobacco: Never Used  Substance and Sexual Activity  . Alcohol use: No  . Drug use: No  . Sexual activity: Never  Lifestyle  . Physical activity:    Days per week: Not on file    Minutes per session: Not on file  . Stress: Not on file  Relationships  . Social connections:    Talks on phone: Not on file    Gets together: Not on file    Attends religious service: Not on file    Active member of club or organization: Not on file    Attends meetings of clubs or organizations: Not on file    Relationship status: Not on file  . Intimate partner violence:    Fear of current or ex partner: Not on file    Emotionally abused: Not on file    Physically abused: Not on file    Forced sexual activity: Not on file  Other Topics Concern  . Not on file  Social History Narrative  [name] -- [employer], graduates in 2019   Lives on campus   Hobbies: reading books, watching Netflix and cartoons    History reviewed. No pertinent surgical history.  Family History  Problem Relation Age of Onset  . Hypertension Mother   . Asthma Sister   . Asthma Brother     Allergies  Allergen Reactions  . Amoxicillin Shortness Of Breath    Has patient had a PCN reaction causing immediate rash, facial/tongue/throat swelling, SOB or lightheadedness with hypotension: Yes Has patient had a PCN reaction causing severe rash involving mucus membranes or skin necrosis: No Has patient had a PCN reaction that required hospitalization: Yes Has patient had a PCN reaction occurring within the last 10 years: Yes If all of the above answers are "NO", then may proceed with Cephalosporin use.   Marland Kitchen Penicillins Shortness Of Breath    Has patient had a PCN reaction causing immediate rash, facial/tongue/throat  swelling, SOB or lightheadedness with hypotension: No Has patient had a PCN reaction causing severe rash involving mucus membranes or skin necrosis: Yes Has patient had a PCN reaction that required hospitalization: Yes Has patient had a PCN reaction occurring within the last 10 years: Yes If all of the above answers are "NO", then may proceed with Cephalosporin use.     Current Medications:   Current Outpatient Medications:  .  albuterol (PROVENTIL HFA;VENTOLIN HFA) 108 (90 Base) MCG/ACT inhaler, Inhale 2 puffs into the lungs every 6 (six) hours as needed for wheezing or shortness of breath., Disp: 1 Inhaler, Rfl: 5 .  albuterol (PROVENTIL) (2.5 MG/3ML) 0.083% nebulizer solution, TAKE 3 MLS BY NEBULIZATION EVERY 6 HOURS AS NEEDED FOR WHEEZING OR SHORTNESS OF BREATH, Disp: 75 mL, Rfl: 1 .  cetirizine (ZYRTEC) 10 MG tablet, TAKE 1 TABLET BY MOUTH DAILY, Disp: 90 tablet, Rfl: 1 .  FLOVENT HFA 110 MCG/ACT inhaler, INHALE 1 PUFF INTO THE LUNGS TWICE DAILY, Disp: 12 g, Rfl: 0 .  montelukast (SINGULAIR) 10 MG tablet, TAKE 1 TABLET(10 MG) BY MOUTH AT BEDTIME, Disp: 90 tablet, Rfl: 1 .  Multiple Vitamin (MULTIVITAMIN WITH MINERALS) TABS tablet, Take 1 tablet by mouth daily., Disp: , Rfl:  .  Phenylephrine-Acetaminophen (TYLENOL SINUS+HEADACHE PO), Take 2 tablets by mouth daily as needed (headache)., Disp: , Rfl:  .  predniSONE (DELTASONE) 20 MG tablet, Take 1 tablet (20 mg total) by mouth daily with breakfast., Disp: 5 tablet, Rfl: 0 .  triamcinolone ointment (KENALOG) 0.5 %, Apply 1 application topically 2 (two) times daily., Disp: 30 g, Rfl: 0 .  hydrOXYzine (ATARAX/VISTARIL) 25 MG tablet, Take one 25 mg tablet as needed up to 4 times a day for anxiety. May increase to two tablets., Disp: 60 tablet, Rfl: 0   Review of Systems:   ROS  Negative unless otherwise specified per HPI.  Vitals:   Vitals:   10/08/18 1302  BP: 116/78  Pulse: (!) 103  Temp: 97.9 F (36.6 C)  TempSrc: Oral  SpO2:  98%  Weight: 148 lb 3.2 oz (67.2 kg)  Height: 5\' 4"  (1.626 m)     Body mass index is 25.44 kg/m.  Physical Exam:   Physical Exam  Constitutional: She appears well-developed. She is cooperative.  Non-toxic appearance. She does not have a sickly appearance. She does not appear ill. No distress.  Neck: Trachea normal and full passive range of motion without pain. No thyroid mass and no thyromegaly present.  Cardiovascular: Normal rate, regular rhythm, S1 normal,  S2 normal, normal heart sounds and normal pulses.  No LE edema  Pulmonary/Chest: Effort normal and breath sounds normal.  Neurological: She is alert. GCS eye subscore is 4. GCS verbal subscore is 5. GCS motor subscore is 6.  Skin: Skin is warm, dry and intact.  Psychiatric: She has a normal mood and affect. Her speech is normal and behavior is normal.  Nursing note and vitals reviewed.   Assessment and Plan:    Catalyna was seen today for follow-up.  Diagnoses and all orders for this visit:  Elevated glucose Will check HgbA1c to r/o DM. -     Hemoglobin A1c  Elevated TSH Re-check TSH and free T4. -     TSH -     T4, free  Hypokalemia Update BMP. -     Basic metabolic panel  Tachycardia and Palpitations Refer to cardiology for further work-up. Discussed that if symptoms change, needs medical evaluation.  Anxiety There is a component of anxiety, her current GAD7 is 14 today. We are going to try prn atarax for her anxiety if needed in the meantime.  Other orders -     hydrOXYzine (ATARAX/VISTARIL) 25 MG tablet; Take one 25 mg tablet as needed up to 4 times a day for anxiety. May increase to two tablets.  . Reviewed expectations re: course of current medical issues. . Discussed self-management of symptoms. . Outlined signs and symptoms indicating need for more acute intervention. . Patient verbalized understanding and all questions were answered. . See orders for this visit as documented in the electronic medical  record. . Patient received an After-Visit Summary.  CMA or LPN served as scribe during this visit. History, Physical, and Plan performed by medical provider. The above documentation has been reviewed and is accurate and complete.  Jarold Motto, PA-C

## 2018-10-08 NOTE — Patient Instructions (Signed)
It was great to see you!  I will be in touch regarding your labs.  Someone will be in touch regarding your cardiology appointment.  If needed, may take Atarax for panic attack symptoms.  Take care,  Jarold Motto PA-C

## 2018-10-09 ENCOUNTER — Encounter: Payer: Self-pay | Admitting: Internal Medicine

## 2018-10-09 ENCOUNTER — Encounter: Payer: Self-pay | Admitting: Physician Assistant

## 2018-10-09 ENCOUNTER — Ambulatory Visit (INDEPENDENT_AMBULATORY_CARE_PROVIDER_SITE_OTHER): Payer: Medicaid Other | Admitting: Internal Medicine

## 2018-10-09 ENCOUNTER — Other Ambulatory Visit: Payer: Self-pay | Admitting: Physician Assistant

## 2018-10-09 VITALS — BP 107/70 | HR 89 | Ht 64.0 in | Wt 151.6 lb

## 2018-10-09 DIAGNOSIS — R002 Palpitations: Secondary | ICD-10-CM | POA: Insufficient documentation

## 2018-10-09 DIAGNOSIS — J4530 Mild persistent asthma, uncomplicated: Secondary | ICD-10-CM

## 2018-10-09 NOTE — Progress Notes (Signed)
OFFICE CONSULT NOTE  Chief Complaint:  Palpitations, tremulousness  Primary Care Physician: Jarold Motto, PA  HPI:  Nancy Gay is a 22 y.o. female who is being seen today for the evaluation of palpitations and tremulousness at the request of Jarold Motto, [place]. 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  She recently has been having tachycardia and palpitations.  This causes her significant anxiety and she has had associated numbness and tingling in her hands and shakiness of her extremities.  She oftentimes feels that her heart could race just at rest.  She was seen by primary care provider and also seen in the ER.  She was given lorazepam which helped her significantly.  Her primary care provider had given her prescription for Atarax.  She has been hesitant to take this due to side effect profile.  She does have asthma and uses inhalers infrequently.  She does not note an increase in her palpitations necessarily with albuterol.  She denies any chest pain.  She is physically active.  No strong family history of heart disease.  PMHx:  Past Medical History:  Diagnosis Date  . Allergy    dust mites, animal fur  . Asthma     No past surgical history on file.  FAMHx:  Family History  Problem Relation Age of Onset  . Hypertension Mother   . Asthma Sister   . Asthma Brother     SOCHx:   reports that she has never smoked. She has never used smokeless tobacco. She reports that she does not drink alcohol or use drugs.  ALLERGIES:  Allergies  Allergen Reactions  . Amoxicillin Shortness Of Breath    Has patient had a PCN reaction causing immediate rash, facial/tongue/throat swelling, SOB or lightheadedness with hypotension: Yes Has patient had a PCN reaction causing severe rash involving mucus membranes or skin necrosis: No Has patient had a PCN reaction that required hospitalization: Yes Has  patient had a PCN reaction occurring within the last 10 years: Yes If all of the above answers are "NO", then may proceed with Cephalosporin use.   Marland Kitchen Penicillins Shortness Of Breath    Has patient had a PCN reaction causing immediate rash, facial/tongue/throat swelling, SOB or lightheadedness with hypotension: No Has patient had a PCN reaction causing severe rash involving mucus membranes or skin necrosis: Yes Has patient had a PCN reaction that required hospitalization: Yes Has patient had a PCN reaction occurring within the last 10 years: Yes If all of the above answers are "NO", then may proceed with Cephalosporin use.     ROS: Pertinent items noted in HPI and remainder of comprehensive ROS otherwise negative.  HOME MEDS: Current Outpatient Medications on File Prior to Visit  Medication Sig Dispense Refill  . albuterol (PROVENTIL HFA;VENTOLIN HFA) 108 (90 Base) MCG/ACT inhaler Inhale 2 puffs into the lungs every 6 (six) hours as needed for wheezing or shortness of breath. 1 Inhaler 5  . albuterol (PROVENTIL) (2.5 MG/3ML) 0.083% nebulizer solution TAKE 3 MLS BY NEBULIZATION EVERY 6 HOURS AS NEEDED FOR WHEEZING OR SHORTNESS OF BREATH 75 mL 1  . cetirizine (ZYRTEC) 10 MG tablet TAKE 1 TABLET BY MOUTH DAILY 90 tablet 1  . FLOVENT HFA 110 MCG/ACT inhaler INHALE 1 PUFF INTO THE LUNGS TWICE DAILY 12 g 0  . montelukast (SINGULAIR) 10 MG tablet TAKE 1 TABLET(10 MG) BY MOUTH AT BEDTIME 90 tablet 1  . Multiple  Vitamin (MULTIVITAMIN WITH MINERALS) TABS tablet Take 1 tablet by mouth daily.    Marland Kitchen triamcinolone ointment (KENALOG) 0.5 % Apply 1 application topically 2 (two) times daily. 30 g 0   No current facility-administered medications on file prior to visit.     LABS/IMAGING: Results for orders placed or performed in visit on 10/08/18 (from the past 48 hour(s))  Hemoglobin A1c     Status: None   Collection Time: 10/08/18  1:34 PM  Result Value Ref Range   Hgb A1c MFr Bld 4.6 4.6 - 6.5 %     Comment: Glycemic Control Guidelines for People with Diabetes:Non Diabetic:  <6%Goal of Therapy: <7%Additional Action Suggested:  >8%   TSH     Status: Abnormal   Collection Time: 10/08/18  1:34 PM  Result Value Ref Range   TSH 5.82 (H) 0.35 - 5.50 uIU/mL  T4, free     Status: None   Collection Time: 10/08/18  1:34 PM  Result Value Ref Range   Free T4 0.84 0.60 - 1.60 ng/dL    Comment: Specimens from patients who are undergoing biotin therapy and /or ingesting biotin supplements may contain high levels of biotin.  The higher biotin concentration in these specimens interferes with this Free T4 assay.  Specimens that contain high levels  of biotin may cause false high results for this Free T4 assay.  Please interpret results in light of the total clinical presentation of the patient.    Basic metabolic panel     Status: Abnormal   Collection Time: 10/08/18  1:34 PM  Result Value Ref Range   Sodium 139 135 - 145 mEq/L   Potassium 3.8 3.5 - 5.1 mEq/L   Chloride 102 96 - 112 mEq/L   CO2 27 19 - 32 mEq/L   Glucose, Bld 89 70 - 99 mg/dL   BUN 13 6 - 23 mg/dL   Creatinine, Ser 1.61 0.40 - 1.20 mg/dL   Calcium 09.6 (H) 8.4 - 10.5 mg/dL   GFR 045.40 >98.11 mL/min   No results found.  LIPID PANEL: No results found for: CHOL, TRIG, HDL, CHOLHDL, VLDL, LDLCALC, LDLDIRECT  WEIGHTS: Wt Readings from Last 3 Encounters:  10/09/18 151 lb 9.6 oz (68.8 kg)  10/08/18 148 lb 3.2 oz (67.2 kg)  10/02/18 152 lb (68.9 kg)    VITALS: BP 107/70   Pulse 89   Ht 5\' 4"  (1.626 m)   Wt 151 lb 9.6 oz (68.8 kg)   LMP 09/24/2018 (Within Days)   SpO2 99%   BMI 26.02 kg/m   EXAM: General appearance: alert and no distress Neck: no carotid bruit, no JVD and thyroid not enlarged, symmetric, no tenderness/mass/nodules Lungs: clear to auscultation bilaterally Heart: regular rate and rhythm Abdomen: soft, non-tender; bowel sounds normal; no masses,  no organomegaly Extremities: extremities normal,  atraumatic, no cyanosis or edema Pulses: 2+ and symmetric Skin: Skin color, texture, turgor normal. No rashes or lesions Neurologic: Grossly normal Psych: Pleasant, mildly anxious  EKG: Sinus tachycardia, borderline delayed upstroke?  WPW variant- personally reviewed  ASSESSMENT: 1. Tachypalpitations 2. Anxiety  PLAN: 1.   Nancy Gay is describing intermittent tachypalpitations and some associated anxiety.  I think a lot of her symptoms including hand numbness and tingling as well as difficulty breathing may be related to her anxiety with regards to her tachypalpitations.  The reason for this is not totally clear.  There is some mild change in her EKG which could suggest underlying bypass tract conduction.  I  would like to obtain a 2-week ZIO monitor for more information about symptomatic palpitations.  She is advised to decrease caffeine, improve sleep and exercise and avoid stimulants and minimize the use of her inhalers much as possible.  Plan follow-up with me afterwards.  Thanks for the kind referral.  Chrystie Nose, MD, [facility]  [place]  [facility]  Medical Director of the [facility]  [facility] Diplomate of the American Board of Clinical Lipidology Attending Cardiologist  Direct Dial: [phone]  Fax: [phone]  Website:  [email or web address] 10/09/2018, 12:56 PM

## 2018-10-09 NOTE — Patient Instructions (Signed)
Medication Instructions:  Continue current medications If you need a refill on your cardiac medications before your next appointment, please call your pharmacy.    Testing/Procedures: ZIO monitor for 14 days -- appointment is at [address]  Follow-Up: At [facility], you and your health needs are our priority.  As part of our continuing mission to provide you with exceptional heart care, we have created designated Provider Care Teams.  These Care Teams include your primary Cardiologist (physician) and Advanced Practice Providers (APPs -  Physician Assistants and Nurse Practitioners) who all work together to provide you with the care you need, when you need it. You will need a follow up appointment in 4 weeks. You may see Dr. Rennis Golden or one of the following Advanced Practice Providers on your designated Care Team: Azalee Course, [place] . Micah Flesher, PA-C  Any Other Special Instructions Will Be Listed Below (If Applicable).

## 2018-10-11 NOTE — Telephone Encounter (Signed)
See note

## 2018-10-11 NOTE — Telephone Encounter (Signed)
Pt called in, [facility] does not carry nebulizer machines.   Pt is asking if you can fax to medical supply store on [address] ( [facility])  cb is [phone]

## 2018-10-14 NOTE — Telephone Encounter (Signed)
Copied from CRM [id number]. Topic: General - Other >> Oct 11, 2018  5:01 PM Marylen Ponto wrote: Reason for CRM: Pt states the Rx for the Nebulizer machine was not received at [facility]. Pt requests call back. Cb# [phone]

## 2018-10-22 ENCOUNTER — Other Ambulatory Visit: Payer: Self-pay

## 2018-10-22 ENCOUNTER — Emergency Department (HOSPITAL_COMMUNITY): Payer: Medicaid Other

## 2018-10-22 ENCOUNTER — Emergency Department (HOSPITAL_COMMUNITY)
Admission: EM | Admit: 2018-10-22 | Discharge: 2018-10-23 | Disposition: A | Discharge status: Home / Self Care | Payer: Medicaid Other | Attending: Emergency Medicine | Admitting: Emergency Medicine

## 2018-10-22 ENCOUNTER — Encounter (HOSPITAL_COMMUNITY): Payer: Self-pay

## 2018-10-22 DIAGNOSIS — R002 Palpitations: Secondary | ICD-10-CM

## 2018-10-22 DIAGNOSIS — G4700 Insomnia, unspecified: Secondary | ICD-10-CM | POA: Insufficient documentation

## 2018-10-22 DIAGNOSIS — Z79899 Other long term (current) drug therapy: Secondary | ICD-10-CM | POA: Diagnosis not present

## 2018-10-22 DIAGNOSIS — J45909 Unspecified asthma, uncomplicated: Secondary | ICD-10-CM | POA: Insufficient documentation

## 2018-10-22 LAB — BASIC METABOLIC PANEL
Anion gap: 6 (ref 5–15)
BUN: 10 mg/dL (ref 6–20)
CHLORIDE: 109 mmol/L (ref 98–111)
CO2: 26 mmol/L (ref 22–32)
CREATININE: 0.83 mg/dL (ref 0.44–1.00)
Calcium: 9.4 mg/dL (ref 8.9–10.3)
GFR calc Af Amer: >60 mL/min (ref >60)
GFR calc non Af Amer: >60 mL/min (ref >60)
Glucose, Bld: 88 mg/dL (ref 70–99)
Potassium: 3.5 mmol/L (ref 3.5–5.1)
SODIUM: 141 mmol/L (ref 135–145)

## 2018-10-22 LAB — CBC
HEMATOCRIT: 41.5 % (ref 36.0–46.0)
Hemoglobin: 13.3 g/dL (ref 12.0–15.0)
MCH: 28.5 pg (ref 26.0–34.0)
MCHC: 32 g/dL (ref 30.0–36.0)
MCV: 89.1 fL (ref 80.0–100.0)
Platelets: 245 10*3/uL (ref 150–400)
RBC: 4.66 MIL/uL (ref 3.87–5.11)
RDW: 12.3 % (ref 11.5–15.5)
WBC: 5.4 10*3/uL (ref 4.0–10.5)
nRBC: 0 % (ref 0.0–0.2)

## 2018-10-22 LAB — I-STAT BETA HCG BLOOD, ED (NOT ORDERABLE)

## 2018-10-22 LAB — POCT I-STAT TROPONIN I: Troponin i, poc: 0 ng/mL (ref 0.00–0.08)

## 2018-10-22 NOTE — ED Triage Notes (Signed)
Pt has been seen for tachycardia here and a cardiologist, she is scheduled next Friday to wear a heart monitor, pt states that she feels better now than she did when she got here but still concerned about her heartrate

## 2018-10-23 ENCOUNTER — Encounter: Payer: Self-pay | Admitting: Physician Assistant

## 2018-10-23 ENCOUNTER — Ambulatory Visit (INDEPENDENT_AMBULATORY_CARE_PROVIDER_SITE_OTHER): Payer: Medicaid Other | Admitting: Physician Assistant

## 2018-10-23 ENCOUNTER — Other Ambulatory Visit: Payer: Self-pay

## 2018-10-23 ENCOUNTER — Encounter: Payer: Self-pay | Admitting: Emergency Medicine

## 2018-10-23 VITALS — BP 110/82 | HR 94 | Temp 98.0°F | Resp 16 | Wt 151.0 lb

## 2018-10-23 DIAGNOSIS — R002 Palpitations: Secondary | ICD-10-CM

## 2018-10-23 DIAGNOSIS — R7989 Other specified abnormal findings of blood chemistry: Secondary | ICD-10-CM | POA: Diagnosis not present

## 2018-10-23 DIAGNOSIS — Z23 Encounter for immunization: Secondary | ICD-10-CM

## 2018-10-23 LAB — TSH: TSH: 4.09 u[IU]/mL (ref 0.35–5.50)

## 2018-10-23 LAB — T4, FREE: Free T4: 0.75 ng/dL (ref 0.60–1.60)

## 2018-10-23 NOTE — Discharge Instructions (Addendum)
Ask your doctor about your elevated TSH in the setting of palpitations, you may need treatment.  Please get the heart monitor as planned by cardiology.

## 2018-10-23 NOTE — ED Provider Notes (Signed)
[facility]-EMERGENCY DEPT Provider Note   CSN: [id number] Arrival date & time: 10/22/18  1843     History   Chief Complaint Chief Complaint  Patient presents with  . irregular heartrate    HPI Nancy Gay is a 22 y.o. female.  Patient presents to the emergency department with a chief complaint of heart palpitations.  She has been seen in the past for the same.  She has been seen by cardiology as well as by her primary care.  They are working her up for mildly elevated TSH and anxiety.  She is scheduled to pick up a heart monitor next week.  She states that she feels okay now, but did have some palpitations earlier today.  She denies any fevers, chills, shortness of breath.  Denies any diaphoresis.  She states that she does have some hard time sleeping at night.  She has been advised to avoid stimulants, caffeine, and to improve her sleep hygiene.  The history is provided by the patient. No language interpreter was used.    Past Medical History:  Diagnosis Date  . Allergy    dust mites, animal fur  . Asthma     Patient Active Problem List   Diagnosis Date Noted  . Palpitations 10/09/2018    History reviewed. No pertinent surgical history.   OB History   None      Home Medications    Prior to Admission medications   Medication Sig Start Date End Date Taking? Authorizing Provider  albuterol (PROVENTIL HFA;VENTOLIN HFA) 108 (90 Base) MCG/ACT inhaler Inhale 2 puffs into the lungs every 6 (six) hours as needed for wheezing or shortness of breath. 05/02/18   Jarold Motto, PA  albuterol (PROVENTIL) (2.5 MG/3ML) 0.083% nebulizer solution TAKE 3 MLS BY NEBULIZATION EVERY 6 HOURS AS NEEDED FOR WHEEZING OR SHORTNESS OF BREATH 05/02/18   Jarold Motto, PA  cetirizine (ZYRTEC) 10 MG tablet TAKE 1 TABLET BY MOUTH DAILY 09/09/18   Shelva Majestic, MD  FLOVENT HFA 110 MCG/ACT inhaler INHALE 1 PUFF INTO THE LUNGS TWICE DAILY 08/19/18   Jarold Motto,  PA  montelukast (SINGULAIR) 10 MG tablet TAKE 1 TABLET(10 MG) BY MOUTH AT BEDTIME 05/02/18   Jarold Motto, PA  Multiple Vitamin (MULTIVITAMIN WITH MINERALS) TABS tablet Take 1 tablet by mouth daily.    [provider]  triamcinolone ointment (KENALOG) 0.5 % Apply 1 application topically 2 (two) times daily. 05/02/18   Jarold Motto, PA    Family History Family History  Problem Relation Age of Onset  . Hypertension Mother   . Asthma Sister   . Asthma Brother     Social History Social History   Tobacco Use  . Smoking status: Never Smoker  . Smokeless tobacco: Never Used  Substance Use Topics  . Alcohol use: No  . Drug use: No     Allergies   Amoxicillin and Penicillins   Review of Systems Review of Systems  All other systems reviewed and are negative.    Physical Exam Updated Vital Signs BP 122/85 (BP Location: Right Arm)   Pulse 63   Temp 98.4 F (36.9 C) (Oral)   Resp 16   Ht 5\' 4"  (1.626 m)   Wt 68.5 kg   LMP 09/24/2018 (Within Days)   SpO2 100%   BMI 25.92 kg/m   Physical Exam  Constitutional: She is oriented to person, place, and time. She appears well-developed and well-nourished.  HENT:  Head: Normocephalic and atraumatic.  Eyes:  Pupils are equal, round, and reactive to light. Conjunctivae and EOM are normal.  Neck: Normal range of motion. Neck supple.  Cardiovascular: Normal rate and regular rhythm. Exam reveals no gallop and no friction rub.  No murmur heard. Pulmonary/Chest: Effort normal and breath sounds normal. No respiratory distress. She has no wheezes. She has no rales. She exhibits no tenderness.  Abdominal: Soft. Bowel sounds are normal. She exhibits no distension and no mass. There is no tenderness. There is no rebound and no guarding.  Musculoskeletal: Normal range of motion. She exhibits no edema or tenderness.  Neurological: She is alert and oriented to person, place, and time.  Skin: Skin is warm and dry.  Psychiatric:  She has a normal mood and affect. Her behavior is normal. Judgment and thought content normal.  Nursing note and vitals reviewed.    ED Treatments / Results  Labs (all labs ordered are listed, but only abnormal results are displayed) Labs Reviewed  BASIC METABOLIC PANEL  CBC  I-STAT TROPONIN, ED  I-STAT BETA HCG BLOOD, ED ([facility], [facility], [facility] ONLY)  I-STAT BETA HCG BLOOD, ED (NOT ORDERABLE)  POCT I-STAT TROPONIN I    EKG None ED ECG REPORT  I personally interpreted this EKG   Date: 10/23/2018   Rate: 72  Rhythm: normal sinus rhythm  QRS Axis: normal  Intervals: normal  ST/T Wave abnormalities: normal  Conduction Disutrbances:none  Narrative Interpretation:   Old EKG Reviewed: Tachycardia resolved   Radiology Dg Chest 2 View  Result Date: 10/22/2018 CLINICAL DATA:  Tachycardia. EXAM: CHEST - 2 VIEW COMPARISON:  08/30/2017 FINDINGS: Lungs are adequately inflated and otherwise clear. Cardiomediastinal silhouette and remainder of the exam is unchanged. IMPRESSION: No active cardiopulmonary disease. Electronically Signed   By: Elberta Fortis M.D.   On: 10/22/2018 20:50    Procedures Procedures (including critical care time)  Medications Ordered in ED Medications - No data to display   Initial Impression / Assessment and Plan / ED Course  I have reviewed the triage vital signs and the nursing notes.  Pertinent labs & imaging results that were available during my care of the patient were reviewed by me and considered in my medical decision making (see chart for details).     Patient with heart palpitations.  History of the same.  Being worked up by PCP and cardiology.  Is scheduled to have a heart monitor placed next week.  She is noted to have mildly elevated TSH, but this seems to be downtrending over her past couple of visits.  It is possible that this could be contributing to her symptoms.  I have advised her to follow-up with her doctor about this as well.  Patient's  laboratory work-up is reassuring here.  EKG is unremarkable, no concerning arrhythmias or ischemic changes.  We will discharge home.  Patient understands and agrees with plan.  Final Clinical Impressions(s) / ED Diagnoses   Final diagnoses:  Palpitations    ED Discharge Orders    None       Roxy Horseman, PA-C 10/23/18 0133    Cathren Laine, MD 10/23/18 [phone]

## 2018-10-23 NOTE — Progress Notes (Signed)
Nancy Gay is a 22 y.o. female here for a follow-up of preexisting problem.  History of Present Illness:   Chief Complaint  Patient presents with  . ER follow-up    HPI   At the beginning of yesterday, she had a feeling of "uneasiness". Went to two classes and felt better. Then took a hot bath and felt overheated and then her palpitations started back up again, prompting her to return to the ER. Was seen in ER yesterday for palpitations -- beta hCG was negative, troponin, CBC and BMP normal. Chest xray and EKG without changes.  She has already been seen by cardiology. She has an appointment with cardiology to pick up a heart monitor next week. It apparently was scheduled earlier but during a time when she could not go.   We have been monitoring her TSH and it has been trending down, most recently 5.8 approx 2 weeks ago. She is wanting to have this re-checked today.   Past Medical History:  Diagnosis Date  . Allergy    dust mites, animal fur  . Asthma      Social History   Socioeconomic History  . Marital status: Single    Spouse name: Not on file  . Number of children: Not on file  . Years of education: Not on file  . Highest education level: Not on file  Occupational History  . Not on file  Social Needs  . Financial resource strain: Not on file  . Food insecurity:    Worry: Not on file    Inability: Not on file  . Transportation needs:    Medical: Not on file    Non-medical: Not on file  Tobacco Use  . Smoking status: Never Smoker  . Smokeless tobacco: Never Used  Substance and Sexual Activity  . Alcohol use: No  . Drug use: No  . Sexual activity: Never  Lifestyle  . Physical activity:    Days per week: Not on file    Minutes per session: Not on file  . Stress: Not on file  Relationships  . Social connections:    Talks on phone: Not on file    Gets together: Not on file    Attends religious service: Not on file    Active member of club or  organization: Not on file    Attends meetings of clubs or organizations: Not on file    Relationship status: Not on file  . Intimate partner violence:    Fear of current or ex partner: Not on file    Emotionally abused: Not on file    Physically abused: Not on file    Forced sexual activity: Not on file  Other Topics Concern  . Not on file  Social History Narrative   Goes by [employer] -- [employer], graduates in 2019   Lives on campus   Hobbies: reading books, watching Netflix and cartoons    No past surgical history on file.  Family History  Problem Relation Age of Onset  . Hypertension Mother   . Asthma Sister   . Asthma Brother     Allergies  Allergen Reactions  . Amoxicillin Shortness Of Breath    Has patient had a PCN reaction causing immediate rash, facial/tongue/throat swelling, SOB or lightheadedness with hypotension: Yes Has patient had a PCN reaction causing severe rash involving mucus membranes or skin necrosis: No Has patient had a PCN reaction that required hospitalization: Yes Has patient had a PCN  reaction occurring within the last 10 years: Yes If all of the above answers are "NO", then may proceed with Cephalosporin use.   Marland Kitchen Penicillins Shortness Of Breath    Has patient had a PCN reaction causing immediate rash, facial/tongue/throat swelling, SOB or lightheadedness with hypotension: No Has patient had a PCN reaction causing severe rash involving mucus membranes or skin necrosis: Yes Has patient had a PCN reaction that required hospitalization: Yes Has patient had a PCN reaction occurring within the last 10 years: Yes If all of the above answers are "NO", then may proceed with Cephalosporin use.     Current Medications:   Current Outpatient Medications:  .  albuterol (PROVENTIL HFA;VENTOLIN HFA) 108 (90 Base) MCG/ACT inhaler, Inhale 2 puffs into the lungs every 6 (six) hours as needed for wheezing or shortness of breath., Disp: 1  Inhaler, Rfl: 5 .  albuterol (PROVENTIL) (2.5 MG/3ML) 0.083% nebulizer solution, TAKE 3 MLS BY NEBULIZATION EVERY 6 HOURS AS NEEDED FOR WHEEZING OR SHORTNESS OF BREATH, Disp: 75 mL, Rfl: 1 .  cetirizine (ZYRTEC) 10 MG tablet, TAKE 1 TABLET BY MOUTH DAILY, Disp: 90 tablet, Rfl: 1 .  FLOVENT HFA 110 MCG/ACT inhaler, INHALE 1 PUFF INTO THE LUNGS TWICE DAILY, Disp: 12 g, Rfl: 0 .  montelukast (SINGULAIR) 10 MG tablet, TAKE 1 TABLET(10 MG) BY MOUTH AT BEDTIME, Disp: 90 tablet, Rfl: 1 .  Multiple Vitamin (MULTIVITAMIN WITH MINERALS) TABS tablet, Take 1 tablet by mouth daily., Disp: , Rfl:  .  triamcinolone ointment (KENALOG) 0.5 %, Apply 1 application topically 2 (two) times daily., Disp: 30 g, Rfl: 0   Review of Systems:   ROS Negative unless otherwise specified per HPI.  Vitals:   Vitals:   10/23/18 1047  BP: 110/82  Pulse: 94  Resp: 16  Temp: 98 F (36.7 C)  TempSrc: Oral  SpO2: 98%  Weight: 151 lb (68.5 kg)     Body mass index is 25.92 kg/m.  Physical Exam:   Physical Exam  Constitutional: She appears well-developed. She is cooperative.  Non-toxic appearance. She does not have a sickly appearance. She does not appear ill. No distress.  Cardiovascular: Normal rate, regular rhythm, S1 normal, S2 normal, normal heart sounds and normal pulses.  No LE edema  Pulmonary/Chest: Effort normal and breath sounds normal.  Neurological: She is alert. GCS eye subscore is 4. GCS verbal subscore is 5. GCS motor subscore is 6.  Skin: Skin is warm, dry and intact.  Psychiatric: She has a normal mood and affect. Her speech is normal and behavior is normal.  Nursing note and vitals reviewed.   Assessment and Plan:    Nancy Gay was seen today for er follow-up.  Diagnoses and all orders for this visit:  Palpitations; Elevated TSH She is not willing to trial any medications for potential anxiety as she is afraid of side effects, we had discussed Atarax as a potential in the past. Discussed  pushing fluids and avoiding stressors if possible. Re-check TSH today per her request. She has been avoiding caffeine. Will reach out to cardiology to see if there is any further recommendation they have regarding her care, possibly getting monitor sooner? ER precautions discussed. -     TSH -     T4, free  . Reviewed expectations re: course of current medical issues. . Discussed self-management of symptoms. . Outlined signs and symptoms indicating need for more acute intervention. . Patient verbalized understanding and all questions were answered. . See orders for this  visit as documented in the electronic medical record. . Patient received an After-Visit Summary.  Jarold Motto, PA-C

## 2018-10-23 NOTE — Patient Instructions (Signed)
It was great to see you!  We are going to reach out to cardiology to see if we can get your monitor placed sooner than 11/01/18.  We will call you regarding your lab results.  Take care,  Jarold Motto PA-C

## 2018-10-24 ENCOUNTER — Encounter: Payer: Self-pay | Admitting: Physician Assistant

## 2018-10-25 ENCOUNTER — Telehealth: Payer: Self-pay | Admitting: Physician Assistant

## 2018-10-25 NOTE — Telephone Encounter (Signed)
Pt calling again.  States that [employer] still hasn't received order for nebulizer.  Pt would like an update on what is going on.   Copied from CRM [id number]. Topic: General - Other >> Oct 11, 2018  5:01 PM Marylen Ponto wrote: Reason for CRM: Pt states the Rx for the Nebulizer machine was not received at [facility]. Pt requests call back. Cb# [phone]

## 2018-10-25 NOTE — Telephone Encounter (Signed)
See note

## 2018-10-27 ENCOUNTER — Other Ambulatory Visit: Payer: Self-pay

## 2018-10-27 ENCOUNTER — Emergency Department (HOSPITAL_COMMUNITY)
Admission: EM | Admit: 2018-10-27 | Discharge: 2018-10-27 | Disposition: A | Discharge status: Home / Self Care | Payer: Medicaid Other | Attending: Emergency Medicine | Admitting: Emergency Medicine

## 2018-10-27 ENCOUNTER — Emergency Department (HOSPITAL_COMMUNITY)
Admission: EM | Admit: 2018-10-27 | Discharge: 2018-10-27 | Disposition: A | Discharge status: Home / Self Care | Payer: Medicaid Other | Source: Home / Self Care | Attending: Emergency Medicine | Admitting: Emergency Medicine

## 2018-10-27 ENCOUNTER — Encounter (HOSPITAL_COMMUNITY): Payer: Self-pay | Admitting: Emergency Medicine

## 2018-10-27 DIAGNOSIS — J4520 Mild intermittent asthma, uncomplicated: Secondary | ICD-10-CM | POA: Diagnosis not present

## 2018-10-27 DIAGNOSIS — R002 Palpitations: Secondary | ICD-10-CM

## 2018-10-27 DIAGNOSIS — Z79899 Other long term (current) drug therapy: Secondary | ICD-10-CM | POA: Insufficient documentation

## 2018-10-27 DIAGNOSIS — J069 Acute upper respiratory infection, unspecified: Secondary | ICD-10-CM | POA: Diagnosis not present

## 2018-10-27 DIAGNOSIS — R0602 Shortness of breath: Secondary | ICD-10-CM | POA: Diagnosis present

## 2018-10-27 HISTORY — DX: Palpitations: R00.2

## 2018-10-27 MED ORDER — PREDNISONE 20 MG PO TABS: 40.0000 mg | ORAL_TABLET | Freq: Every day | ORAL | 0 refills | Status: DC

## 2018-10-27 MED ORDER — PREDNISONE 20 MG PO TABS
40.0000 mg | ORAL_TABLET | Freq: Once | ORAL | Status: AC
  Administered 2018-10-27: 40 mg via ORAL
  Filled 2018-10-27: qty 2

## 2018-10-27 NOTE — ED Triage Notes (Signed)
Pt c/o heart palpitations that is ongoing. Pt was eval for palpitations and states she is to see a cardiologist but has not. Pt also reports seen at [facility] today for asthma, given prednisone and using inhalers. Denies pain.

## 2018-10-27 NOTE — Discharge Instructions (Signed)
Prednisone daily for 5 days as prescribed  Use flonase nasal spray or nasonex for the nasal symptoms.  If your symptoms last longer than 10 days, you should be rechecked to consider antibiotics however most of these infections are related to viruses and will go away in 7-10 days without any specific interventions.

## 2018-10-27 NOTE — ED Triage Notes (Signed)
Pt states she feels her asthma is exacerbated. Taking her pro-air inhaler and singulair with no relief. States her neb machine at home is broken so she hasn't used it lately. Pt endorses congestion and "mucus" in her throat.

## 2018-10-27 NOTE — Discharge Instructions (Signed)
It was our pleasure to provide your ER care today - we hope that you feel better.  Your ECG shows that you are in a normal heart rhythm.   Minimize any caffeine use.   Follow up with cardiologist as planned this week.  Return to ER if worse, new symptoms, fainting, trouble breathing, other concern.

## 2018-10-27 NOTE — ED Provider Notes (Addendum)
[facility]-EMERGENCY DEPT Provider Note   CSN: [id number] Arrival date & time: 10/27/18  1156     History   Chief Complaint Chief Complaint  Patient presents with  . heart palpitations    HPI Nancy Gay is a 22 y.o. female.  Patient c/o palpitations intermittently in past week. Symptoms episodic, acute onset, moderate, intermittent. States similar symptoms in past. Feels as if heart beats fast. Lasts seconds per episode. No syncope. Denies prior dx pvc, afib, or svt. Denies heat intolerance, sweats or recent wt change. No chest pain or discomfort. No unusual doe.   The history is provided by the patient.    Past Medical History:  Diagnosis Date  . Allergy    dust mites, animal fur  . Asthma   . Heart palpitations     Patient Active Problem List   Diagnosis Date Noted  . Palpitations 10/09/2018    History reviewed. No pertinent surgical history.   OB History   None      Home Medications    Prior to Admission medications   Medication Sig Start Date End Date Taking? Authorizing Provider  albuterol (PROVENTIL HFA;VENTOLIN HFA) 108 (90 Base) MCG/ACT inhaler Inhale 2 puffs into the lungs every 6 (six) hours as needed for wheezing or shortness of breath. 05/02/18   Jarold Motto, PA  albuterol (PROVENTIL) (2.5 MG/3ML) 0.083% nebulizer solution TAKE 3 MLS BY NEBULIZATION EVERY 6 HOURS AS NEEDED FOR WHEEZING OR SHORTNESS OF BREATH 05/02/18   Jarold Motto, PA  cetirizine (ZYRTEC) 10 MG tablet TAKE 1 TABLET BY MOUTH DAILY 09/09/18   Shelva Majestic, MD  FLOVENT HFA 110 MCG/ACT inhaler INHALE 1 PUFF INTO THE LUNGS TWICE DAILY 08/19/18   Jarold Motto, PA  montelukast (SINGULAIR) 10 MG tablet TAKE 1 TABLET(10 MG) BY MOUTH AT BEDTIME 05/02/18   Jarold Motto, PA  Multiple Vitamin (MULTIVITAMIN WITH MINERALS) TABS tablet Take 1 tablet by mouth daily.    [provider]  predniSONE (DELTASONE) 20 MG tablet Take 2 tablets (40 mg total)  by mouth daily. 10/27/18   Eber Hong, MD  triamcinolone ointment (KENALOG) 0.5 % Apply 1 application topically 2 (two) times daily. 05/02/18   Jarold Motto, PA    Family History Family History  Problem Relation Age of Onset  . Hypertension Mother   . Asthma Sister   . Asthma Brother     Social History Social History   Tobacco Use  . Smoking status: Never Smoker  . Smokeless tobacco: Never Used  Substance Use Topics  . Alcohol use: No  . Drug use: No     Allergies   Amoxicillin and Penicillins   Review of Systems Review of Systems  Constitutional: Negative for fever.  Eyes: Negative for redness.  Respiratory: Negative for shortness of breath.   Cardiovascular: Positive for palpitations. Negative for chest pain and leg swelling.  Gastrointestinal: Negative for abdominal pain, diarrhea and vomiting.  Genitourinary: Negative for flank pain.  Musculoskeletal: Negative for back pain and neck pain.  Skin: Negative for rash.  Neurological: Negative for headaches.  Hematological: Does not bruise/bleed easily.  Psychiatric/Behavioral: Negative for confusion.     Physical Exam Updated Vital Signs BP 117/81 (BP Location: Right Arm)   Pulse 93   Temp 97.9 F (36.6 C) (Oral)   Resp 17   LMP 10/25/2018 (Exact Date)   SpO2 100%   Physical Exam  Constitutional: She appears well-developed and well-nourished.  HENT:  Head: Atraumatic.  Eyes: Conjunctivae are  normal. No scleral icterus.  Neck: Neck supple. No tracheal deviation present. No thyromegaly present.  Cardiovascular: Normal rate, regular rhythm, normal heart sounds and intact distal pulses. Exam reveals no gallop and no friction rub.  No murmur heard. Pulmonary/Chest: Effort normal and breath sounds normal. No respiratory distress.  Abdominal: Soft. Normal appearance and bowel sounds are normal. She exhibits no distension. There is no tenderness.  Musculoskeletal: She exhibits no edema or tenderness.    Neurological: She is alert.  Skin: Skin is warm and dry. No rash noted.  Psychiatric: She has a normal mood and affect.  Nursing note and vitals reviewed.    ED Treatments / Results  Labs (all labs ordered are listed, but only abnormal results are displayed) Labs Reviewed - No data to display  ED ECG REPORT   Date: 10/27/2018  Rate: 100  Rhythm: normal sinus rhythm  QRS Axis: normal  Intervals: normal  ST/T Wave abnormalities: normal  Conduction Disutrbances:none  Narrative Interpretation:   Old EKG Reviewed: unchanged  I have personally reviewed the EKG tracing Radiology No results found.  Procedures Procedures (including critical care time)  Medications Ordered in ED Medications - No data to display   Initial Impression / Assessment and Plan / ED Course  I have reviewed the triage vital signs and the nursing notes.  Pertinent labs & imaging results that were available during my care of the patient were reviewed by me and considered in my medical decision making (see chart for details).  Reviewed nursing notes and prior charts for additional history.  Pt with recent ED visit and workup for same - labs including thyroid fxn, chem/cbc, appear normal. Recent cxr normal.   Patient is in nsr in ED. No pain or sob.   Pt indicates has f/u arranged with cardiology for outpatient monitor this coming week - will encourage to keep that f/u.  Pt denies any heavy caffeine use or any stimulant drug/medication or energy drink use. Uses albuterol prn, denies recent increased use.   Pt currently appears stable for d/c.     Final Clinical Impressions(s) / ED Diagnoses   Final diagnoses:  None    ED Discharge Orders    None           Cathren Laine, MD 10/27/18 1245

## 2018-10-27 NOTE — ED Notes (Signed)
Patient left at this time with all belongings. 

## 2018-10-27 NOTE — ED Provider Notes (Signed)
[facility] EMERGENCY DEPARTMENT Provider Note   CSN: [id number] Arrival date & time: 10/27/18  0533     History   Chief Complaint Chief Complaint  Patient presents with  . Asthma    HPI Nancy Gay is a 22 y.o. female.  HPI  22 year old female with a known history of asthma as well as seasonal allergies, currently taking albuterol daily, Flovent daily, Singulair daily and cetirizine daily.  She has had several days of worsening shortness of breath with a cough with some clear phlegm, runny nose, she denies fevers or chest pain but has had a tightness in her chest which seems to get some temporary relief when she uses albuterol.  She has had no other sick contacts, no other risk factors, no significant travel and no fevers.  No meds other than albuterol given prior to arrival.  Past Medical History:  Diagnosis Date  . Allergy    dust mites, animal fur  . Asthma     Patient Active Problem List   Diagnosis Date Noted  . Palpitations 10/09/2018    History reviewed. No pertinent surgical history.   OB History   None     Home Medications    Prior to Admission medications   Medication Sig Start Date End Date Taking? Authorizing Provider  albuterol (PROVENTIL HFA;VENTOLIN HFA) 108 (90 Base) MCG/ACT inhaler Inhale 2 puffs into the lungs every 6 (six) hours as needed for wheezing or shortness of breath. 05/02/18   Jarold Motto, PA  albuterol (PROVENTIL) (2.5 MG/3ML) 0.083% nebulizer solution TAKE 3 MLS BY NEBULIZATION EVERY 6 HOURS AS NEEDED FOR WHEEZING OR SHORTNESS OF BREATH 05/02/18   Jarold Motto, PA  cetirizine (ZYRTEC) 10 MG tablet TAKE 1 TABLET BY MOUTH DAILY 09/09/18   Shelva Majestic, MD  FLOVENT HFA 110 MCG/ACT inhaler INHALE 1 PUFF INTO THE LUNGS TWICE DAILY 08/19/18   Jarold Motto, PA  montelukast (SINGULAIR) 10 MG tablet TAKE 1 TABLET(10 MG) BY MOUTH AT BEDTIME 05/02/18   Jarold Motto, PA  Multiple Vitamin (MULTIVITAMIN WITH  MINERALS) TABS tablet Take 1 tablet by mouth daily.    [provider]  predniSONE (DELTASONE) 20 MG tablet Take 2 tablets (40 mg total) by mouth daily. 10/27/18   Eber Hong, MD  triamcinolone ointment (KENALOG) 0.5 % Apply 1 application topically 2 (two) times daily. 05/02/18   Jarold Motto, PA    Family History Family History  Problem Relation Age of Onset  . Hypertension Mother   . Asthma Sister   . Asthma Brother     Social History Social History   Tobacco Use  . Smoking status: Never Smoker  . Smokeless tobacco: Never Used  Substance Use Topics  . Alcohol use: No  . Drug use: No     Allergies   Amoxicillin and Penicillins   Review of Systems Review of Systems  All other systems reviewed and are negative.    Physical Exam Updated Vital Signs BP 124/86 (BP Location: Right Arm)   Pulse 88   Temp 98.3 F (36.8 C) (Oral)   Resp 18   SpO2 100%   Physical Exam  Constitutional: She appears well-developed and well-nourished. No distress.  HENT:  Head: Normocephalic and atraumatic.  Mouth/Throat: Oropharynx is clear and moist. No oropharyngeal exudate.  Eyes: Pupils are equal, round, and reactive to light. Conjunctivae and EOM are normal. Right eye exhibits no discharge. Left eye exhibits no discharge. No scleral icterus.  Neck: Normal range of motion. Neck  supple. No JVD present. No thyromegaly present.  Cardiovascular: Normal rate, regular rhythm, normal heart sounds and intact distal pulses. Exam reveals no gallop and no friction rub.  No murmur heard. Pulmonary/Chest: Effort normal and breath sounds normal. No respiratory distress. She has no wheezes. She has no rales.  Abdominal: Soft. Bowel sounds are normal. She exhibits no distension and no mass. There is no tenderness.  Musculoskeletal: Normal range of motion. She exhibits no edema or tenderness.  Lymphadenopathy:    She has no cervical adenopathy.  Neurological: She is alert. Coordination  normal.  Skin: Skin is warm and dry. No rash noted. No erythema.  Psychiatric: She has a normal mood and affect. Her behavior is normal.  Nursing note and vitals reviewed.    ED Treatments / Results  Labs (all labs ordered are listed, but only abnormal results are displayed) Labs Reviewed - No data to display  EKG None  Radiology No results found.  Procedures Procedures (including critical care time)  Medications Ordered in ED Medications  predniSONE (DELTASONE) tablet 40 mg (has no administration in time range)     Initial Impression / Assessment and Plan / ED Course  I have reviewed the triage vital signs and the nursing notes.  Pertinent labs & imaging results that were available during my care of the patient were reviewed by me and considered in my medical decision making (see chart for details).     The patient's exam is rather unremarkable, she has clear lungs without any wheezing, she is not tachypneic or tachycardic and has no peripheral edema.  Overall the patient likely has a viral upper respiratory infection.  She was concerned that she may need an antibiotic, I had a discussion with the patient regarding the likely viral nature of her symptoms and the benefit of using a short course of prednisone in addition to her bronchodilators.  She expressed understanding and at this time is stable for discharge.    Final Clinical Impressions(s) / ED Diagnoses   Final diagnoses:  Mild intermittent asthma, unspecified whether complicated  Viral upper respiratory tract infection    ED Discharge Orders         Ordered    predniSONE (DELTASONE) 20 MG tablet  Daily     10/27/18 0611           Eber Hong, MD 10/27/18 [phone]

## 2018-10-28 ENCOUNTER — Ambulatory Visit: Payer: Medicaid Other | Admitting: Physician Assistant

## 2018-10-28 ENCOUNTER — Emergency Department (HOSPITAL_COMMUNITY)
Admission: EM | Admit: 2018-10-28 | Discharge: 2018-10-28 | Disposition: A | Discharge status: Home / Self Care | Payer: Medicaid Other | Attending: Emergency Medicine | Admitting: Emergency Medicine

## 2018-10-28 ENCOUNTER — Emergency Department (HOSPITAL_COMMUNITY): Payer: Medicaid Other

## 2018-10-28 ENCOUNTER — Ambulatory Visit: Payer: Self-pay | Admitting: *Deleted

## 2018-10-28 ENCOUNTER — Encounter (HOSPITAL_COMMUNITY): Payer: Self-pay | Admitting: Emergency Medicine

## 2018-10-28 ENCOUNTER — Other Ambulatory Visit: Payer: Self-pay

## 2018-10-28 DIAGNOSIS — R002 Palpitations: Secondary | ICD-10-CM

## 2018-10-28 DIAGNOSIS — J45909 Unspecified asthma, uncomplicated: Secondary | ICD-10-CM | POA: Insufficient documentation

## 2018-10-28 DIAGNOSIS — F430 Acute stress reaction: Secondary | ICD-10-CM | POA: Diagnosis not present

## 2018-10-28 DIAGNOSIS — F419 Anxiety disorder, unspecified: Secondary | ICD-10-CM | POA: Diagnosis not present

## 2018-10-28 NOTE — ED Provider Notes (Addendum)
[facility]   CSN: [id number] Arrival date & time: 10/28/18  1101     History   Chief Complaint Chief Complaint  Patient presents with  . Palpitations  . Anxiety    HPI Nancy Gay is a 22 y.o. female.  22 year old female with history of anxiety seen here recently for same presents with worsening feeling of palpitations and impending doom.  Denies any use of illicit drugs or caffeine.  No suicidal homicidal ideations.  Has appointment to be seen by her doctor today in 1 hour.  Does admit to increased stress and anxiety.  Is been seen by her doctor for similar symptoms and prescribed Vistaril which she said did help.  She is also given Xanax which also helped.  She however is not taking those at this time.  Today while at school she felt this way for palpitations and came here for further management.  Recently also started on prednisone which she thinks has made her symptoms worse     Past Medical History:  Diagnosis Date  . Allergy    dust mites, animal fur  . Asthma   . Heart palpitations     Patient Active Problem List   Diagnosis Date Noted  . Palpitations 10/09/2018    History reviewed. No pertinent surgical history.   OB History   None      Home Medications    Prior to Admission medications   Medication Sig Start Date End Date Taking? Authorizing Provider  albuterol (PROVENTIL HFA;VENTOLIN HFA) 108 (90 Base) MCG/ACT inhaler Inhale 2 puffs into the lungs every 6 (six) hours as needed for wheezing or shortness of breath. 05/02/18  Yes Worley, Lelon Mast, PA  albuterol (PROVENTIL) (2.5 MG/3ML) 0.083% nebulizer solution TAKE 3 MLS BY NEBULIZATION EVERY 6 HOURS AS NEEDED FOR WHEEZING OR SHORTNESS OF BREATH 05/02/18  Yes Bufford Buttner, [place], PA  cetirizine (ZYRTEC) 10 MG tablet TAKE 1 TABLET BY MOUTH DAILY 09/09/18  Yes Shelva Majestic, MD  FLOVENT HFA 110 MCG/ACT inhaler INHALE 1 PUFF INTO THE LUNGS TWICE  DAILY Patient taking differently: Inhale 1 puff into the lungs 2 (two) times daily.  08/19/18  Yes Worley, Lelon Mast, PA  IRON PO Take 1 tablet by mouth daily.   Yes [provider]  montelukast (SINGULAIR) 10 MG tablet TAKE 1 TABLET(10 MG) BY MOUTH AT BEDTIME 05/02/18  Yes Jarold Motto, PA  predniSONE (DELTASONE) 20 MG tablet Take 2 tablets (40 mg total) by mouth daily. 10/27/18  Yes Eber Hong, MD  triamcinolone ointment (KENALOG) 0.5 % Apply 1 application topically 2 (two) times daily. Patient not taking: Reported on 10/28/2018 05/02/18   Jarold Motto, PA    Family History Family History  Problem Relation Age of Onset  . Hypertension Mother   . Asthma Sister   . Asthma Brother     Social History Social History   Tobacco Use  . Smoking status: Never Smoker  . Smokeless tobacco: Never Used  Substance Use Topics  . Alcohol use: No  . Drug use: No     Allergies   Amoxicillin and Penicillins   Review of Systems Review of Systems  All other systems reviewed and are negative.    Physical Exam Updated Vital Signs BP 130/81 (BP Location: Right Arm)   Pulse (!) 102   Temp 97.7 F (36.5 C) (Oral)   Resp 18   LMP 10/25/2018 (Exact Date)   SpO2 100%   Physical Exam  Constitutional: She is  oriented to person, place, and time. She appears well-developed and well-nourished.  Non-toxic appearance. No distress.  HENT:  Head: Normocephalic and atraumatic.  Eyes: Pupils are equal, round, and reactive to light. Conjunctivae, EOM and lids are normal.  Neck: Normal range of motion. Neck supple. No tracheal deviation present. No thyroid mass present.  Cardiovascular: Normal rate, regular rhythm and normal heart sounds. Exam reveals no gallop.  No murmur heard. Pulmonary/Chest: Effort normal and breath sounds normal. No stridor. No respiratory distress. She has no decreased breath sounds. She has no wheezes. She has no rhonchi. She has no rales.  Abdominal: Soft.  Normal appearance and bowel sounds are normal. She exhibits no distension. There is no tenderness. There is no rebound and no CVA tenderness.  Musculoskeletal: Normal range of motion. She exhibits no edema or tenderness.  Neurological: She is alert and oriented to person, place, and time. She has normal strength. No cranial nerve deficit or sensory deficit. GCS eye subscore is 4. GCS verbal subscore is 5. GCS motor subscore is 6.  Skin: Skin is warm and dry. No abrasion and no rash noted.  Psychiatric: Her speech is normal and behavior is normal. Her affect is blunt. She expresses no homicidal and no suicidal ideation. She expresses no suicidal plans and no homicidal plans.  Nursing note and vitals reviewed.    ED Treatments / Results  Labs (all labs ordered are listed, but only abnormal results are displayed) Labs Reviewed - No data to display  EKG None ED ECG REPORT   Date: 10/28/2018  Rate: 98  Rhythm: normal sinus rhythm  QRS Axis: normal  Intervals: normal  ST/T Wave abnormalities: normal  Conduction Disutrbances:none  Narrative Interpretation:   Old EKG Reviewed: none available  I have personally reviewed the EKG tracing and agree with the computerized printout as noted.  Radiology No results found.  Procedures Procedures (including critical care time)  Medications Ordered in ED Medications - No data to display   Initial Impression / Assessment and Plan / ED Course  I have reviewed the triage vital signs and the nursing notes.  Pertinent labs & imaging results that were available during my care of the patient were reviewed by me and considered in my medical decision making (see chart for details).     Patient is EKG without signs of acute ischemic changes.  QT is not prolonged.  Patient's recent ED visits were reviewed.  Patient's thyroid function studies from 5 days ago noted.  Patient has denied pleuritic chest pain or symptoms concerning for PE or ACS.  Patient  has an appointment to be seen by her doctor today and I have encouraged her to keep that appointment.  Final Clinical Impressions(s) / ED Diagnoses   Final diagnoses:  None    ED Discharge Orders    None       Lorre Nick, MD 10/28/18 1158    Lorre Nick, MD 10/28/18 1158

## 2018-10-28 NOTE — Telephone Encounter (Signed)
Pt came in for appt but was late had to reschedule. Pt given copy of order for Nebulizer.

## 2018-10-28 NOTE — ED Triage Notes (Signed)
Pt reports that she was in school today when out of no where started having heart palpitations and anxiousness. Reports is supposed to she her PCP later on today.

## 2018-10-28 NOTE — ED Triage Notes (Signed)
Pt reports when was seen at [facility] ED yesterday she was started on Prednisone for URI. Has productive cough.

## 2018-10-29 ENCOUNTER — Encounter: Payer: Self-pay | Admitting: Physician Assistant

## 2018-10-29 ENCOUNTER — Ambulatory Visit (INDEPENDENT_AMBULATORY_CARE_PROVIDER_SITE_OTHER): Payer: Medicaid Other

## 2018-10-29 ENCOUNTER — Encounter (HOSPITAL_COMMUNITY): Payer: Self-pay | Admitting: *Deleted

## 2018-10-29 ENCOUNTER — Emergency Department (HOSPITAL_COMMUNITY)
Admission: EM | Admit: 2018-10-29 | Discharge: 2018-10-29 | Disposition: A | Discharge status: Home / Self Care | Payer: Medicaid Other | Attending: Emergency Medicine | Admitting: Emergency Medicine

## 2018-10-29 ENCOUNTER — Ambulatory Visit: Payer: Medicaid Other | Admitting: Physician Assistant

## 2018-10-29 ENCOUNTER — Other Ambulatory Visit: Payer: Self-pay

## 2018-10-29 ENCOUNTER — Ambulatory Visit (INDEPENDENT_AMBULATORY_CARE_PROVIDER_SITE_OTHER): Payer: Medicaid Other | Admitting: Physician Assistant

## 2018-10-29 VITALS — BP 110/70 | HR 87 | Temp 98.6°F | Ht 64.0 in | Wt 148.4 lb

## 2018-10-29 DIAGNOSIS — J45909 Unspecified asthma, uncomplicated: Secondary | ICD-10-CM | POA: Insufficient documentation

## 2018-10-29 DIAGNOSIS — R002 Palpitations: Secondary | ICD-10-CM | POA: Diagnosis not present

## 2018-10-29 DIAGNOSIS — R251 Tremor, unspecified: Secondary | ICD-10-CM | POA: Diagnosis present

## 2018-10-29 DIAGNOSIS — Z79899 Other long term (current) drug therapy: Secondary | ICD-10-CM | POA: Insufficient documentation

## 2018-10-29 DIAGNOSIS — F419 Anxiety disorder, unspecified: Secondary | ICD-10-CM | POA: Insufficient documentation

## 2018-10-29 LAB — CBC WITH DIFFERENTIAL/PLATELET
BASOS ABS: 0 10*3/uL (ref 0.0–0.1)
Basophils Relative: 1.3 % (ref 0.0–3.0)
EOS ABS: 0.1 10*3/uL (ref 0.0–0.7)
Eosinophils Relative: 3.5 % (ref 0.0–5.0)
HCT: 43.3 % (ref 36.0–46.0)
Hemoglobin: 14.4 g/dL (ref 12.0–15.0)
LYMPHS ABS: 1.3 10*3/uL (ref 0.7–4.0)
Lymphocytes Relative: 33.2 % (ref 12.0–46.0)
MCHC: 33.2 g/dL (ref 30.0–36.0)
MCV: 87.5 fl (ref 78.0–100.0)
MONOS PCT: 9.5 % (ref 3.0–12.0)
Monocytes Absolute: 0.4 10*3/uL (ref 0.1–1.0)
NEUTROS ABS: 2 10*3/uL (ref 1.4–7.7)
Neutrophils Relative %: 52.5 % (ref 43.0–77.0)
PLATELETS: 233 10*3/uL (ref 150.0–400.0)
RBC: 4.95 Mil/uL (ref 3.87–5.11)
RDW: 13 % (ref 11.5–14.6)
WBC: 3.8 10*3/uL — ABNORMAL LOW (ref 4.5–10.5)

## 2018-10-29 LAB — TSH: TSH: 4.53 u[IU]/mL (ref 0.35–5.50)

## 2018-10-29 NOTE — ED Provider Notes (Signed)
[facility] Provider Note  CSN: [id number] Arrival date & time: 10/29/18 0217  Chief Complaint(s) Anxiety  HPI Nancy Gay is a 22 y.o. female who presents with sensation of shaking.  States that she has several episodes earlier today and was able to stop herself earlier today however tonight she felt like she could not stop the shaking.  She believes this is related to starting new medication, Vistaril recently.  She denies any recent fevers or infections.  No headache, focal deficits, numbness.  No associated nausea vomiting.  Patient also stated that she use her albuterol inhaler prior to the shaking.  HPI  Past Medical History Past Medical History:  Diagnosis Date  . Allergy    dust mites, animal fur  . Asthma   . Heart palpitations    Patient Active Problem List   Diagnosis Date Noted  . Palpitations 10/09/2018   Home Medication(s) Prior to Admission medications   Medication Sig Start Date End Date Taking? Authorizing Provider  albuterol (PROVENTIL HFA;VENTOLIN HFA) 108 (90 Base) MCG/ACT inhaler Inhale 2 puffs into the lungs every 6 (six) hours as needed for wheezing or shortness of breath. 05/02/18   Jarold Motto, PA  albuterol (PROVENTIL) (2.5 MG/3ML) 0.083% nebulizer solution TAKE 3 MLS BY NEBULIZATION EVERY 6 HOURS AS NEEDED FOR WHEEZING OR SHORTNESS OF BREATH 05/02/18   Jarold Motto, PA  cetirizine (ZYRTEC) 10 MG tablet TAKE 1 TABLET BY MOUTH DAILY 09/09/18   Shelva Majestic, MD  FLOVENT HFA 110 MCG/ACT inhaler INHALE 1 PUFF INTO THE LUNGS TWICE DAILY Patient taking differently: Inhale 1 puff into the lungs 2 (two) times daily.  08/19/18   Jarold Motto, PA  IRON PO Take 1 tablet by mouth daily.    [provider]  montelukast (SINGULAIR) 10 MG tablet TAKE 1 TABLET(10 MG) BY MOUTH AT BEDTIME 05/02/18   Jarold Motto, PA  predniSONE (DELTASONE) 20 MG tablet Take 2 tablets (40 mg total) by mouth daily. 10/27/18    Eber Hong, MD  triamcinolone ointment (KENALOG) 0.5 % Apply 1 application topically 2 (two) times daily. Patient not taking: Reported on 10/28/2018 05/02/18   Jarold Motto, PA                                                                                                                                    Past Surgical History History reviewed. No pertinent surgical history. Family History Family History  Problem Relation Age of Onset  . Hypertension Mother   . Asthma Sister   . Asthma Brother     Social History Social History   Tobacco Use  . Smoking status: Never Smoker  . Smokeless tobacco: Never Used  Substance Use Topics  . Alcohol use: No  . Drug use: No   Allergies Amoxicillin and Penicillins  Review of Systems Review of Systems As noted in HPI Physical Exam Vital Signs  I have reviewed  the triage vital signs BP 138/89 (BP Location: Left Arm)   Pulse 90   Temp 98.5 F (36.9 C) (Oral)   Resp 16   Ht 5\' 4"  (1.626 m)   Wt 68.5 kg   LMP 10/25/2018 (Exact Date)   BMI 25.92 kg/m   Physical Exam  Constitutional: She is oriented to person, place, and time. She appears well-developed and well-nourished. No distress.  HENT:  Head: Normocephalic and atraumatic.  Nose: Nose normal.  Eyes: Pupils are equal, round, and reactive to light. Conjunctivae and EOM are normal. Right eye exhibits no discharge. Left eye exhibits no discharge. No scleral icterus.  Neck: Normal range of motion. Neck supple.  Cardiovascular: Normal rate and regular rhythm. Exam reveals no gallop and no friction rub.  No murmur heard. Pulmonary/Chest: Effort normal and breath sounds normal. No stridor. No respiratory distress. She has no rales.  Abdominal: Soft. She exhibits no distension. There is no tenderness.  Musculoskeletal: She exhibits no edema or tenderness.  Neurological: She is alert and oriented to person, place, and time. She displays no tremor.  Skin: Skin is warm and dry. No  rash noted. She is not diaphoretic. No erythema.  Psychiatric: She has a normal mood and affect.  Vitals reviewed.   ED Results and Treatments Labs (all labs ordered are listed, but only abnormal results are displayed) Labs Reviewed - No data to display                                                                                                                       EKG  EKG Interpretation  Date/Time:    Ventricular Rate:    PR Interval:    QRS Duration:   QT Interval:    QTC Calculation:   R Axis:     Text Interpretation:        Radiology Dg Chest 2 View  Result Date: 10/28/2018 CLINICAL DATA:  Heart palpitations and weakness. EXAM: CHEST - 2 VIEW COMPARISON:  10/22/2018 FINDINGS: The heart size and mediastinal contours are within normal limits. Both lungs are clear. The visualized skeletal structures are unremarkable. IMPRESSION: No active cardiopulmonary disease. Electronically Signed   By: Elberta Fortis M.D.   On: 10/28/2018 12:16   Pertinent labs & imaging results that were available during my care of the patient were reviewed by me and considered in my medical decision making (see chart for details).  Medications Ordered in ED Medications - No data to display  Procedures Procedures  (including critical care time)  Medical Decision Making / ED Course I have reviewed the nursing notes for this encounter and the patient's prior records (if available in EHR or on provided paperwork).    Doubt this is related to Vistaril unless there is a paradoxical response.  Possibly due to albuterol use. Also possibly due to anxiety.  Patient is afebrile with stable vital signs and well-appearing.  No need for labs or imaging at this time.  Recommend she keep a journal of her symptoms and follow-up with her primary care provider if symptoms  continue.  The patient is safe for discharge with strict return precautions.  Final Clinical Impression(s) / ED Diagnoses Final diagnoses:  Anxiety    Disposition: Discharge  Condition: Good  I have discussed the results, Dx and Tx plan with the patient who expressed understanding and agree(s) with the plan. Discharge instructions discussed at great length. The patient was given strict return precautions who verbalized understanding of the instructions. No further questions at time of discharge.    ED Discharge Orders    None        This chart was dictated using voice recognition software.  Despite best efforts to proofread,  errors can occur which can change the documentation meaning.   Nira Conn, MD 10/29/18 [phone]

## 2018-10-29 NOTE — Patient Instructions (Signed)
It was great to see you!  1. Call [facility] for counseling. 2. Consider taking Atarax for your palpitations. Another medication we could consider is propranolol. 3. I think we should consider a daily medication for your anxiety such as Lexapro or Zoloft.  We will be in touch with your labs.  Take care,  Jarold Motto PA-C

## 2018-10-29 NOTE — ED Notes (Signed)
Bed: WTR6 Expected date:  Expected time:  Means of arrival:  Comments: 

## 2018-10-29 NOTE — Progress Notes (Signed)
Nancy Gay is a 22 y.o. female is here to discuss: Palpitations  I acted as a Neurosurgeon for [employer], PA-C Corky Mull, LPN  History of Present Illness:   Chief Complaint  Patient presents with  . Palpitations    HPI Patient has been to ER this morning (11/5), yesterday (11/4), 11/3, 10/29 and 10/9 for these episodes. She has seen cardiology who is putting a monitor on her tomorrow. She has been told that her symptoms are possibly related to anxiety, asthma, her heart, her thyroid. She is very anxious. Her GAD-7 is 16 today. She is worried about her health. She is missing classes due to her having to go to the ER so frequently. She doesn't feel like herself. She tried the Atarax once and felt like it helped but then states "I don't like the way that it made me feel afterwards."  She states that her family is coming in to town this afternoon to be with her.   Denies SI/HI.  Labs, xrays and EKGs has all been normal thus far, except for elevated TSH which when we last checked is normal.  Lab Results  Component Value Date   TSH 4.09 10/23/2018      Health Maintenance Due  Topic Date Due  . HIV Screening  04/06/2013    Past Medical History:  Diagnosis Date  . Allergy    dust mites, animal fur  . Asthma   . Heart palpitations      Social History   Socioeconomic History  . Marital status: Single    Spouse name: Not on file  . Number of children: Not on file  . Years of education: Not on file  . Highest education level: Not on file  Occupational History  . Not on file  Social Needs  . Financial resource strain: Not on file  . Food insecurity:    Worry: Not on file    Inability: Not on file  . Transportation needs:    Medical: Not on file    Non-medical: Not on file  Tobacco Use  . Smoking status: Never Smoker  . Smokeless tobacco: Never Used  Substance and Sexual Activity  . Alcohol use: No  . Drug use: No  . Sexual activity: Never  Lifestyle   . Physical activity:    Days per week: Not on file    Minutes per session: Not on file  . Stress: Not on file  Relationships  . Social connections:    Talks on phone: Not on file    Gets together: Not on file    Attends religious service: Not on file    Active member of club or organization: Not on file    Attends meetings of clubs or organizations: Not on file    Relationship status: Not on file  . Intimate partner violence:    Fear of current or ex partner: Not on file    Emotionally abused: Not on file    Physically abused: Not on file    Forced sexual activity: Not on file  Other Topics Concern  . 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 No pertinent surgical history.  Family History  Problem Relation Age of Onset  . Hypertension Mother   . Asthma Sister   . Asthma Brother  PMHx, SurgHx, SocialHx, FamHx, Medications, and Allergies were reviewed in the Visit Navigator and updated as appropriate.   Patient Active Problem List   Diagnosis Date Noted  . Asthma 10/29/2018  . Palpitations 10/09/2018    Social History   Tobacco Use  . Smoking status: Never Smoker  . Smokeless tobacco: Never Used  Substance Use Topics  . Alcohol use: No  . Drug use: No    Current Medications and Allergies:    Current Outpatient Medications:  .  albuterol (PROVENTIL HFA;VENTOLIN HFA) 108 (90 Base) MCG/ACT inhaler, Inhale 2 puffs into the lungs every 6 (six) hours as needed for wheezing or shortness of breath., Disp: 1 Inhaler, Rfl: 5 .  albuterol (PROVENTIL) (2.5 MG/3ML) 0.083% nebulizer solution, TAKE 3 MLS BY NEBULIZATION EVERY 6 HOURS AS NEEDED FOR WHEEZING OR SHORTNESS OF BREATH, Disp: 75 mL, Rfl: 1 .  cetirizine (ZYRTEC) 10 MG tablet, TAKE 1 TABLET BY MOUTH DAILY, Disp: 90 tablet, Rfl: 1 .  FLOVENT HFA 110  MCG/ACT inhaler, INHALE 1 PUFF INTO THE LUNGS TWICE DAILY (Patient taking differently: Inhale 1 puff into the lungs 2 (two) times daily. ), Disp: 12 g, Rfl: 0 .  hydrOXYzine (ATARAX/VISTARIL) 25 MG tablet, Take 25 mg by mouth as needed., Disp: , Rfl:  .  IRON PO, Take 1 tablet by mouth daily., Disp: , Rfl:  .  montelukast (SINGULAIR) 10 MG tablet, TAKE 1 TABLET(10 MG) BY MOUTH AT BEDTIME, Disp: 90 tablet, Rfl: 1 .  predniSONE (DELTASONE) 20 MG tablet, Take 2 tablets (40 mg total) by mouth daily. (Patient not taking: Reported on 10/29/2018), Disp: 10 tablet, Rfl: 0   Allergies  Allergen Reactions  . Amoxicillin Shortness Of Breath    Has patient had a PCN reaction causing immediate rash, facial/tongue/throat swelling, SOB or lightheadedness with hypotension: Yes Has patient had a PCN reaction causing severe rash involving mucus membranes or skin necrosis: No Has patient had a PCN reaction that required hospitalization: Yes Has patient had a PCN reaction occurring within the last 10 years: Yes If all of the above answers are "NO", then may proceed with Cephalosporin use.   Marland Kitchen Penicillins Shortness Of Breath    Has patient had a PCN reaction causing immediate rash, facial/tongue/throat swelling, SOB or lightheadedness with hypotension: No Has patient had a PCN reaction causing severe rash involving mucus membranes or skin necrosis: Yes Has patient had a PCN reaction that required hospitalization: Yes Has patient had a PCN reaction occurring within the last 10 years: Yes If all of the above answers are "NO", then may proceed with Cephalosporin use.     Review of Systems   Review of Systems  Negative unless otherwise specified per HPI.  Vitals:   Vitals:   10/29/18 0828  BP: 110/70  Pulse: 87  Temp: 98.6 F (37 C)  TempSrc: Oral  SpO2: 100%  Weight: 148 lb 6.1 oz (67.3 kg)  Height: 5\' 4"  (1.626 m)     Body mass index is 25.47 kg/m.   Physical Exam:    Physical Exam    Constitutional: She appears well-developed. She is cooperative.  Non-toxic appearance. She does not have a sickly appearance. She does not appear ill. No distress.  HENT:  Head: Normocephalic and atraumatic.  Right Ear: Tympanic membrane, external ear and ear canal normal. Tympanic membrane is not erythematous, not retracted and not bulging.  Left Ear: Tympanic membrane, external ear and ear canal normal. Tympanic membrane is not erythematous, not retracted and not  bulging.  Nose: Nose normal. Right sinus exhibits no maxillary sinus tenderness and no frontal sinus tenderness. Left sinus exhibits no maxillary sinus tenderness and no frontal sinus tenderness.  Mouth/Throat: Uvula is midline. No posterior oropharyngeal edema or posterior oropharyngeal erythema.  Eyes: Conjunctivae and lids are normal.  Neck: Trachea normal.  Cardiovascular: Normal rate, regular rhythm, S1 normal, S2 normal and normal heart sounds.  Pulmonary/Chest: Effort normal and breath sounds normal. She has no decreased breath sounds. She has no wheezes. She has no rhonchi. She has no rales.  Lymphadenopathy:    She has no cervical adenopathy.  Neurological: She is alert.  Skin: Skin is warm, dry and intact.  Psychiatric: Her speech is normal and behavior is normal. Her mood appears anxious.  Nursing note and vitals reviewed.    Assessment and Plan:    Tzivia was seen today for palpitations.  Diagnoses and all orders for this visit:  Palpitations and Anxiety No red flags on exam. She is quite anxious. We had a long discussion about starting medications but she is extremely reluctant. She is concerned that medications will cause worsening asthma. Discussed that uncontrolled anxiety can also cause issues with panic attack like symptoms and could stress her breathing if it remains uncontrolled. She does not want to start or change any medications at this time. We considered trying propranolol or buspar prn. We also  discussed starting daily medication such as zoloft or lexapro but she declined all of these and states "I need to research them." At the minimum, I strongly encouraged her to please call the counseling center at [facility] to make an appointment to set up an appointment. She was agreeable to this. Will also check TPO and iron panel. -     HIV Antibody (routine testing w rflx) -     Thyroid Peroxidase Antibodies (TPO) (REFL) -     TSH -     Iron, TIBC and Ferritin Panel -     CBC with Differential/Platelet  . Reviewed expectations re: course of current medical issues. . Discussed self-management of symptoms. . Outlined signs and symptoms indicating need for more acute intervention. . Patient verbalized understanding and all questions were answered. . See orders for this visit as documented in the electronic medical record. . Patient received an After Visit Summary.  CMA or LPN served as scribe during this visit. History, Physical, and Plan performed by medical provider. The above documentation has been reviewed and is accurate and complete.  Jarold Motto, PA-C [place] [place] 10/29/2018  Follow-up: No follow-ups on file.

## 2018-10-29 NOTE — ED Triage Notes (Signed)
Pt states she was started on Vistaril, took dose yesterday, tonight could not sleep due to shaking, thinks it is a medication reaction.

## 2018-10-30 ENCOUNTER — Encounter: Payer: Self-pay | Admitting: Physician Assistant

## 2018-10-30 LAB — IRON,TIBC AND FERRITIN PANEL
%SAT: 22 % (calc) (ref 16–45)
Ferritin: 41 ng/mL (ref 16–154)
Iron: 88 ug/dL (ref 40–190)
TIBC: 404 mcg/dL (calc) (ref 250–450)

## 2018-10-30 LAB — THYROID PEROXIDASE ANTIBODIES (TPO) (REFL): Thyroperoxidase Ab SerPl-aCnc: 1 IU/mL (ref <9)

## 2018-10-30 LAB — HIV ANTIBODY (ROUTINE TESTING W REFLEX): HIV 1&2 Ab, 4th Generation: NONREACTIVE

## 2018-11-04 ENCOUNTER — Ambulatory Visit (INDEPENDENT_AMBULATORY_CARE_PROVIDER_SITE_OTHER): Payer: Medicaid Other | Admitting: Physician Assistant

## 2018-11-04 ENCOUNTER — Ambulatory Visit: Payer: Medicaid Other | Admitting: Physician Assistant

## 2018-11-04 ENCOUNTER — Encounter: Payer: Self-pay | Admitting: Physician Assistant

## 2018-11-04 VITALS — BP 106/70 | HR 73 | Temp 98.6°F | Ht 64.0 in | Wt 150.0 lb

## 2018-11-04 DIAGNOSIS — F419 Anxiety disorder, unspecified: Secondary | ICD-10-CM

## 2018-11-04 NOTE — Progress Notes (Signed)
Nancy Gay is a 22 y.o. female is here to discuss: Lab results  I acted as a Neurosurgeon for [employer], PA-C Corky Mull, LPN  History of Present Illness:   Chief Complaint  Patient presents with  . Discuss Test Results    HPI  Patient reports that she is doing well overall but continues to have significant health anxiety. She states that she has had intermittent palpitations but she is currently wearing her heart monitor from the cardiologist and is hoping that it is capturing some data. She does state that she had one day of feeling like she was lacking motivation and didn't want to get out of her bed. Her family (who is approximately 2-3 hours away) came to see her for a bit last week and she noted that her symptoms were much improved while in their company. When asked about her support system locally while at school she states that she has friends but doesn't seem that often. She also admits to sometimes feeling lonely at her apartment even though she has roommates. Denies any SI/HI.  We briefly discussed her cold hands. She denies, changes in colors to hands (white or blue fingertips) when exposed to cold or history of Reynaud's.  There are no preventive care reminders to display for this patient.  Past Medical History:  Diagnosis Date  . Allergy    dust mites, animal fur  . Asthma   . Heart palpitations      Social History   Socioeconomic History  . Marital status: Single    Spouse name: Not on file  . Number of children: Not on file  . Years of education: Not on file  . Highest education level: Not on file  Occupational History  . Not on file  Social Needs  . Financial resource strain: Not on file  . Food insecurity:    Worry: Not on file    Inability: Not on file  . Transportation needs:    Medical: Not on file    Non-medical: Not on file  Tobacco Use  . Smoking status: Never Smoker  . Smokeless tobacco: Never Used  Substance and Sexual Activity    . Alcohol use: No  . Drug use: No  . Sexual activity: Never  Lifestyle  . Physical activity:    Days per week: Not on file    Minutes per session: Not on file  . Stress: Not on file  Relationships  . Social connections:    Talks on phone: Not on file    Gets together: Not on file    Attends religious service: Not on file    Active member of club or organization: Not on file    Attends meetings of clubs or organizations: Not on file    Relationship status: Not on file  . Intimate partner violence:    Fear of current or ex partner: Not on file    Emotionally abused: Not on file    Physically abused: Not on file    Forced sexual activity: Not on file  Other Topics Concern  . 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 No pertinent surgical history.  Family History  Problem Relation Age of Onset  . Hypertension Mother   . Asthma Sister   . Asthma Brother  PMHx, SurgHx, SocialHx, FamHx, Medications, and Allergies were reviewed in the Visit Navigator and updated as appropriate.   Patient Active Problem List   Diagnosis Date Noted  . Asthma 10/29/2018  . Palpitations 10/09/2018    Social History   Tobacco Use  . Smoking status: Never Smoker  . Smokeless tobacco: Never Used  Substance Use Topics  . Alcohol use: No  . Drug use: No    Current Medications and Allergies:    Current Outpatient Medications:  .  albuterol (PROVENTIL HFA;VENTOLIN HFA) 108 (90 Base) MCG/ACT inhaler, Inhale 2 puffs into the lungs every 6 (six) hours as needed for wheezing or shortness of breath., Disp: 1 Inhaler, Rfl: 5 .  albuterol (PROVENTIL) (2.5 MG/3ML) 0.083% nebulizer solution, TAKE 3 MLS BY NEBULIZATION EVERY 6 HOURS AS NEEDED FOR WHEEZING OR SHORTNESS OF BREATH, Disp: 75 mL, Rfl: 1 .  cetirizine (ZYRTEC) 10 MG tablet,  TAKE 1 TABLET BY MOUTH DAILY, Disp: 90 tablet, Rfl: 1 .  FLOVENT HFA 110 MCG/ACT inhaler, INHALE 1 PUFF INTO THE LUNGS TWICE DAILY (Patient taking differently: Inhale 1 puff into the lungs 2 (two) times daily. ), Disp: 12 g, Rfl: 0 .  hydrOXYzine (ATARAX/VISTARIL) 25 MG tablet, Take 25 mg by mouth as needed., Disp: , Rfl:  .  IRON PO, Take 1 tablet by mouth daily., Disp: , Rfl:  .  montelukast (SINGULAIR) 10 MG tablet, TAKE 1 TABLET(10 MG) BY MOUTH AT BEDTIME, Disp: 90 tablet, Rfl: 1   Allergies  Allergen Reactions  . Amoxicillin Shortness Of Breath    Has patient had a PCN reaction causing immediate rash, facial/tongue/throat swelling, SOB or lightheadedness with hypotension: Yes Has patient had a PCN reaction causing severe rash involving mucus membranes or skin necrosis: No Has patient had a PCN reaction that required hospitalization: Yes Has patient had a PCN reaction occurring within the last 10 years: Yes If all of the above answers are "NO", then may proceed with Cephalosporin use.   Marland Kitchen Penicillins Shortness Of Breath    Has patient had a PCN reaction causing immediate rash, facial/tongue/throat swelling, SOB or lightheadedness with hypotension: No Has patient had a PCN reaction causing severe rash involving mucus membranes or skin necrosis: Yes Has patient had a PCN reaction that required hospitalization: Yes Has patient had a PCN reaction occurring within the last 10 years: Yes If all of the above answers are "NO", then may proceed with Cephalosporin use.     Review of Systems   ROS  Negative unless otherwise specified per HPI.  Vitals:   Vitals:   11/04/18 1336  BP: 106/70  Pulse: 73  Temp: 98.6 F (37 C)  TempSrc: Oral  SpO2: 99%  Weight: 150 lb (68 kg)  Height: 5\' 4"  (1.626 m)     Body mass index is 25.75 kg/m.   Physical Exam:   Physical Exam  Constitutional: She appears well-developed. She is cooperative.  Non-toxic appearance. She does not have a sickly  appearance. She does not appear ill. No distress.  Cardiovascular: Normal rate, regular rhythm, S1 normal, S2 normal, normal heart sounds and normal pulses.  No LE edema  Pulmonary/Chest: Effort normal and breath sounds normal.  Neurological: She is alert. GCS eye subscore is 4. GCS verbal subscore is 5. GCS motor subscore is 6.  Skin: Skin is warm, dry and intact.  Psychiatric: She has a normal mood and affect. Her speech is normal and behavior is normal.  Nursing note and vitals reviewed.  Assessment and Plan:    [place] was seen today for discuss test results.  Diagnoses and all orders for this visit:  Anxiety   No red flags on exam. She is wearing a monitor today from the cardiologist. We discussed medication for anxiety but she would like to wait on this. Discussed importance of calling student health services at [facility] to get in with a therapist.  . Reviewed expectations re: course of current medical issues. . Discussed self-management of symptoms. . Outlined signs and symptoms indicating need for more acute intervention. . Patient verbalized understanding and all questions were answered. . See orders for this visit as documented in the electronic medical record. . Patient received an After Visit Summary.  CMA or LPN served as scribe during this visit. History, Physical, and Plan performed by medical provider. The above documentation has been reviewed and is accurate and complete.  Jarold Motto, PA-C [place], [place] 11/04/2018  Follow-up: No follow-ups on file.

## 2018-11-04 NOTE — Patient Instructions (Addendum)
It was great to see you!   1. Do not google symptoms 2. Please try and make an appointment with a counselor at [facility]   Let's follow-up in 1 month, sooner if you have concerns.  Take care,  Jarold Motto PA-C

## 2018-11-05 ENCOUNTER — Encounter: Payer: Self-pay | Admitting: Physician Assistant

## 2018-11-07 ENCOUNTER — Encounter: Payer: Self-pay | Admitting: Physician Assistant

## 2018-11-13 ENCOUNTER — Encounter: Payer: Self-pay | Admitting: Physician Assistant

## 2018-11-13 ENCOUNTER — Encounter: Payer: Self-pay | Admitting: Internal Medicine

## 2018-11-13 ENCOUNTER — Ambulatory Visit (INDEPENDENT_AMBULATORY_CARE_PROVIDER_SITE_OTHER): Payer: Medicaid Other | Admitting: Internal Medicine

## 2018-11-13 VITALS — BP 118/82 | HR 72 | Ht 64.5 in | Wt 151.2 lb

## 2018-11-13 DIAGNOSIS — R002 Palpitations: Secondary | ICD-10-CM

## 2018-11-13 NOTE — Progress Notes (Signed)
OFFICE CONSULT NOTE  Chief Complaint:  Follow-up palpitations  Primary Care Physician: Jarold Motto, PA  HPI:  Nancy Gay is a 22 y.o. female who is being seen today for the evaluation of palpitations and tremulousness at the request of Jarold Motto, [place]. 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  She recently has been having tachycardia and palpitations.  This causes her significant anxiety and she has had associated numbness and tingling in her hands and shakiness of her extremities.  She oftentimes feels that her heart could race just at rest.  She was seen by primary care provider and also seen in the ER.  She was given lorazepam which helped her significantly.  Her primary care provider had given her prescription for Atarax.  She has been hesitant to take this due to side effect profile.  She does have asthma and uses inhalers infrequently.  She does not note an increase in her palpitations necessarily with albuterol.  She denies any chest pain.  She is physically active.  No strong family history of heart disease.  11/13/2018  Nancy Gay returns today for follow-up follow-up of her palpitations.  She reports marked improvement in her symptoms after decreasing caffeine, increasing her exercise and starting to do yoga.  Somehow she manages to do this in addition to her studies at [employer] and has seen some results.  She did wear a ZIO monitor for 8 days and 9 hours.  During that monitoring time there were very infrequent atrial ectopic events.  Maximal heart rate was up to 175 however she said this was on November 12 during an exercise class.  This appears to be a sinus rhythm.  Overall her heart rate is been lower and she is less symptomatic.  PMHx:  Past Medical History:  Diagnosis Date  . Allergy    dust mites, animal fur  . Asthma   . Heart palpitations     No past surgical history on  file.  FAMHx:  Family History  Problem Relation Age of Onset  . Hypertension Mother   . Asthma Sister   . Asthma Brother     SOCHx:   reports that she has never smoked. She has never used smokeless tobacco. She reports that she does not drink alcohol or use drugs.  ALLERGIES:  Allergies  Allergen Reactions  . Amoxicillin Shortness Of Breath    Has patient had a PCN reaction causing immediate rash, facial/tongue/throat swelling, SOB or lightheadedness with hypotension: Yes Has patient had a PCN reaction causing severe rash involving mucus membranes or skin necrosis: No Has patient had a PCN reaction that required hospitalization: Yes Has patient had a PCN reaction occurring within the last 10 years: Yes If all of the above answers are "NO", then may proceed with Cephalosporin use.   Marland Kitchen Penicillins Shortness Of Breath    Has patient had a PCN reaction causing immediate rash, facial/tongue/throat swelling, SOB or lightheadedness with hypotension: No Has patient had a PCN reaction causing severe rash involving mucus membranes or skin necrosis: Yes Has patient had a PCN reaction that required hospitalization: Yes Has patient had a PCN reaction occurring within the last 10 years: Yes If all of the above answers are "NO", then may proceed with Cephalosporin use.     ROS: Pertinent items noted in HPI and remainder of comprehensive ROS otherwise negative.  HOME MEDS: Current Outpatient Medications on File Prior  to Visit  Medication Sig Dispense Refill  . albuterol (PROVENTIL HFA;VENTOLIN HFA) 108 (90 Base) MCG/ACT inhaler Inhale 2 puffs into the lungs every 6 (six) hours as needed for wheezing or shortness of breath. 1 Inhaler 5  . albuterol (PROVENTIL) (2.5 MG/3ML) 0.083% nebulizer solution TAKE 3 MLS BY NEBULIZATION EVERY 6 HOURS AS NEEDED FOR WHEEZING OR SHORTNESS OF BREATH 75 mL 1  . cetirizine (ZYRTEC) 10 MG tablet TAKE 1 TABLET BY MOUTH DAILY 90 tablet 1  . FLOVENT HFA 110  MCG/ACT inhaler INHALE 1 PUFF INTO THE LUNGS TWICE DAILY (Patient taking differently: Inhale 1 puff into the lungs 2 (two) times daily. ) 12 g 0  . hydrOXYzine (ATARAX/VISTARIL) 25 MG tablet Take 25 mg by mouth as needed.    . IRON PO Take 1 tablet by mouth daily.    . montelukast (SINGULAIR) 10 MG tablet TAKE 1 TABLET(10 MG) BY MOUTH AT BEDTIME 90 tablet 1   No current facility-administered medications on file prior to visit.     LABS/IMAGING: No results found for this or any previous visit (from the past 48 hour(s)). No results found.  LIPID PANEL: No results found for: CHOL, TRIG, HDL, CHOLHDL, VLDL, LDLCALC, LDLDIRECT  WEIGHTS: Wt Readings from Last 3 Encounters:  11/13/18 151 lb 3.2 oz (68.6 kg)  11/04/18 150 lb (68 kg)  10/29/18 148 lb 6.1 oz (67.3 kg)    VITALS: BP 118/82   Pulse 72   Ht 5' 4.5" (1.638 m)   Wt 151 lb 3.2 oz (68.6 kg)   LMP 10/25/2018 (Exact Date)   SpO2 100%   BMI 25.55 kg/m   EXAM: Deferred  EKG: Deferred  ASSESSMENT: 1. Tachypalpitations 2. Anxiety  PLAN: 1.   Nancy Gay had significant improvement in her palpitations with dietary and lifestyle alterations as well as increasing exercise.  Her monitor showed no significant abnormalities.  No further work-up was recommended.  Follow-up with me as needed.  Chrystie NoseKenneth C. Violette Morneault, MD, [facility]FACC, FACP  [place]  [facility]  Medical Director of the [facility] &  Cardiovascular Risk Reduction Clinic Diplomate of the American Board of Clinical Lipidology Attending Cardiologist  Direct Dial: [phone]  Fax: [phone]  Website:  [email or web address]  Troi Bechtold C Shaena Parkerson 11/13/2018, 1:55 PM

## 2018-11-13 NOTE — Patient Instructions (Signed)
Your physician recommends that you schedule a follow-up appointment as needed with Dr. Hilty.  

## 2018-11-19 ENCOUNTER — Ambulatory Visit: Payer: Medicaid Other | Admitting: Physician Assistant

## 2018-11-20 ENCOUNTER — Other Ambulatory Visit: Payer: Self-pay | Admitting: Physician Assistant

## 2018-11-20 ENCOUNTER — Other Ambulatory Visit: Payer: Self-pay | Admitting: *Deleted

## 2018-11-20 ENCOUNTER — Encounter: Payer: Self-pay | Admitting: Physician Assistant

## 2018-11-20 MED ORDER — ALBUTEROL SULFATE HFA 108 (90 BASE) MCG/ACT IN AERS: 2.0000 | INHALATION_SPRAY | Freq: Four times a day (QID) | RESPIRATORY_TRACT | 5 refills | Status: DC | PRN

## 2018-12-03 ENCOUNTER — Ambulatory Visit (INDEPENDENT_AMBULATORY_CARE_PROVIDER_SITE_OTHER): Payer: Medicaid Other | Admitting: Physician Assistant

## 2018-12-03 ENCOUNTER — Encounter: Payer: Self-pay | Admitting: Physician Assistant

## 2018-12-03 VITALS — BP 106/68 | HR 74 | Temp 98.0°F | Ht 64.5 in | Wt 147.8 lb

## 2018-12-03 DIAGNOSIS — F419 Anxiety disorder, unspecified: Secondary | ICD-10-CM | POA: Diagnosis not present

## 2018-12-03 MED ORDER — SERTRALINE HCL 25 MG PO TABS: 25.0000 mg | ORAL_TABLET | Freq: Every day | ORAL | 1 refills | Status: DC

## 2018-12-03 NOTE — Progress Notes (Signed)
Nancy Gay is a 22 y.o. female here for a new problem.  History of Present Illness:   Chief Complaint  Patient presents with  . Anxiety    HPI   Has been going to yoga and seeing her therapist. Working on eating healthier and drinking water.  Denies further palpitations. Over the weekend she developed some chest tightness. Used inhaler but without relief. Overall her chest tightness has improved and going away with time.  She denies chest pain.  We have been talking for several visits, see previous notes, about starting medication for anxiety.  She has finally come to the conclusion that she needs to start any medication.  She is not using her Atarax.  She would like to start a daily medication.   Wt Readings from Last 4 Encounters:  12/03/18 147 lb 12 oz (67 kg)  11/13/18 151 lb 3.2 oz (68.6 kg)  11/04/18 150 lb (68 kg)  10/29/18 148 lb 6.1 oz (67.3 kg)     GAD 7 : Generalized Anxiety Score 12/03/2018 10/29/2018 10/08/2018  Nervous, Anxious, on Edge 1 1 2   Control/stop worrying 1 3 2   Worry too much - different things 2 2 3   Trouble relaxing 3 3 3   Restless 0 3 1  Easily annoyed or irritable 1 1 0  Afraid - awful might happen 2 3 3   Total GAD 7 Score 10 16 14   Anxiety Difficulty Very difficult Very difficult Somewhat difficult      Past Medical History:  Diagnosis Date  . Allergy    dust mites, animal fur  . Asthma   . Heart palpitations      Social History   Socioeconomic History  . Marital status: Single    Spouse name: Not on file  . Number of children: Not on file  . Years of education: Not on file  . Highest education level: Not on file  Occupational History  . Not on file  Social Needs  . Financial resource strain: Not on file  . Food insecurity:    Worry: Not on file    Inability: Not on file  . Transportation needs:    Medical: Not on file    Non-medical: Not on file  Tobacco Use  . Smoking status: Never Smoker  . Smokeless tobacco: Never  Used  Substance and Sexual Activity  . Alcohol use: No  . Drug use: No  . Sexual activity: Never  Lifestyle  . Physical activity:    Days per week: Not on file    Minutes per session: Not on file  . Stress: Not on file  Relationships  . Social connections:    Talks on phone: Not on file    Gets together: Not on file    Attends religious service: Not on file    Active member of club or organization: Not on file    Attends meetings of clubs or organizations: Not on file    Relationship status: Not on file  . Intimate partner violence:    Fear of current or ex partner: Not on file    Emotionally abused: Not on file    Physically abused: Not on file    Forced sexual activity: Not on file  Other Topics Concern  . 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  No pertinent surgical history.  Family History  Problem Relation Age of Onset  . Hypertension Mother   . Asthma Sister   . Asthma Brother     Allergies  Allergen Reactions  . Amoxicillin Shortness Of Breath    Has patient had a PCN reaction causing immediate rash, facial/tongue/throat swelling, SOB or lightheadedness with hypotension: Yes Has patient had a PCN reaction causing severe rash involving mucus membranes or skin necrosis: No Has patient had a PCN reaction that required hospitalization: Yes Has patient had a PCN reaction occurring within the last 10 years: Yes If all of the above answers are "NO", then may proceed with Cephalosporin use.   Marland Kitchen. Penicillins Shortness Of Breath    Has patient had a PCN reaction causing immediate rash, facial/tongue/throat swelling, SOB or lightheadedness with hypotension: No Has patient had a PCN reaction causing severe rash involving mucus membranes or skin necrosis: Yes Has patient had a PCN reaction that required  hospitalization: Yes Has patient had a PCN reaction occurring within the last 10 years: Yes If all of the above answers are "NO", then may proceed with Cephalosporin use.     Current Medications:   Current Outpatient Medications:  .  albuterol (PROVENTIL HFA;VENTOLIN HFA) 108 (90 Base) MCG/ACT inhaler, Inhale 2 puffs into the lungs every 6 (six) hours as needed for wheezing or shortness of breath., Disp: 1 Inhaler, Rfl: 5 .  albuterol (PROVENTIL) (2.5 MG/3ML) 0.083% nebulizer solution, TAKE 3 MLS BY NEBULIZATION EVERY 6 HOURS AS NEEDED FOR WHEEZING OR SHORTNESS OF BREATH, Disp: 75 mL, Rfl: 1 .  cetirizine (ZYRTEC) 10 MG tablet, TAKE 1 TABLET BY MOUTH DAILY, Disp: 90 tablet, Rfl: 1 .  FLOVENT HFA 110 MCG/ACT inhaler, INHALE 1 PUFF INTO THE LUNGS TWICE DAILY, Disp: 12 g, Rfl: 0 .  montelukast (SINGULAIR) 10 MG tablet, TAKE 1 TABLET(10 MG) BY MOUTH AT BEDTIME, Disp: 90 tablet, Rfl: 1 .  sertraline (ZOLOFT) 25 MG tablet, Take 1 tablet (25 mg total) by mouth daily., Disp: 30 tablet, Rfl: 1   Review of Systems:   ROS Negative unless otherwise specified per HPI.  Vitals:   Vitals:   12/03/18 1340  BP: 106/68  Pulse: 74  Temp: 98 F (36.7 C)  TempSrc: Oral  SpO2: 99%  Weight: 147 lb 12 oz (67 kg)  Height: 5' 4.5" (1.638 m)     Body mass index is 24.97 kg/m.  Physical Exam:   Physical Exam  Constitutional: She appears well-developed. She is cooperative.  Non-toxic appearance. She does not have a sickly appearance. She does not appear ill. No distress.  Cardiovascular: Normal rate, regular rhythm, S1 normal, S2 normal, normal heart sounds and normal pulses.  No LE edema  Pulmonary/Chest: Effort normal and breath sounds normal.  Neurological: She is alert. GCS eye subscore is 4. GCS verbal subscore is 5. GCS motor subscore is 6.  Skin: Skin is warm, dry and intact.  Psychiatric: She has a normal mood and affect. Her speech is normal and behavior is normal.  Nursing note and vitals  reviewed.   Assessment and Plan:   Nancy Gay was seen today for anxiety.  Diagnoses and all orders for this visit:  Anxiety  Other orders -     sertraline (ZOLOFT) 25 MG tablet; Take 1 tablet (25 mg total) by mouth daily.   Long discussion regarding this today.  Start Zoloft 25 mg daily.  Follow-up in 4 to 6 weeks. I discussed with patient that if they develop  any SI, to tell someone immediately and seek medical attention.   . Reviewed expectations re: course of current medical issues. . Discussed self-management of symptoms. . Outlined signs and symptoms indicating need for more acute intervention. . Patient verbalized understanding and all questions were answered. . See orders for this visit as documented in the electronic medical record. . Patient received an After-Visit Summary.  CMA or LPN served as scribe during this visit. History, Physical, and Plan performed by medical provider. The above documentation has been reviewed and is accurate and complete.  Jarold Motto, PA-C

## 2018-12-03 NOTE — Patient Instructions (Signed)
It was great to see you!  Start Zoloft 25 mg daily.  Let's follow-up in 4-6 weeks, sooner if you have concerns.  Take care,  Jarold MottoSamantha Taneika Choi PA-C

## 2018-12-31 ENCOUNTER — Ambulatory Visit: Payer: Medicaid Other | Admitting: Physician Assistant

## 2019-01-24 ENCOUNTER — Ambulatory Visit (INDEPENDENT_AMBULATORY_CARE_PROVIDER_SITE_OTHER): Payer: Medicaid Other | Admitting: Physician Assistant

## 2019-01-24 ENCOUNTER — Ambulatory Visit: Payer: Medicaid Other | Admitting: Physician Assistant

## 2019-01-24 ENCOUNTER — Encounter: Payer: Self-pay | Admitting: Physician Assistant

## 2019-01-24 VITALS — BP 106/60 | HR 76 | Temp 98.7°F | Ht 65.0 in | Wt 149.5 lb

## 2019-01-24 DIAGNOSIS — Z13220 Encounter for screening for lipoid disorders: Secondary | ICD-10-CM

## 2019-01-24 DIAGNOSIS — Z136 Encounter for screening for cardiovascular disorders: Secondary | ICD-10-CM | POA: Diagnosis not present

## 2019-01-24 DIAGNOSIS — J4520 Mild intermittent asthma, uncomplicated: Secondary | ICD-10-CM

## 2019-01-24 DIAGNOSIS — R002 Palpitations: Secondary | ICD-10-CM

## 2019-01-24 DIAGNOSIS — Z0000 Encounter for general adult medical examination without abnormal findings: Secondary | ICD-10-CM

## 2019-01-24 DIAGNOSIS — F419 Anxiety disorder, unspecified: Secondary | ICD-10-CM

## 2019-01-24 LAB — CBC WITH DIFFERENTIAL/PLATELET
BASOS PCT: 0.9 % (ref 0.0–3.0)
Basophils Absolute: 0 10*3/uL (ref 0.0–0.1)
EOS PCT: 3 % (ref 0.0–5.0)
Eosinophils Absolute: 0.1 10*3/uL (ref 0.0–0.7)
HCT: 40 % (ref 36.0–46.0)
Hemoglobin: 13.4 g/dL (ref 12.0–15.0)
LYMPHS ABS: 1.4 10*3/uL (ref 0.7–4.0)
Lymphocytes Relative: 36.2 % (ref 12.0–46.0)
MCHC: 33.5 g/dL (ref 30.0–36.0)
MCV: 87.4 fl (ref 78.0–100.0)
MONO ABS: 0.4 10*3/uL (ref 0.1–1.0)
Monocytes Relative: 9.6 % (ref 3.0–12.0)
NEUTROS PCT: 50.3 % (ref 43.0–77.0)
Neutro Abs: 1.9 10*3/uL (ref 1.4–7.7)
PLATELETS: 234 10*3/uL (ref 150.0–400.0)
RBC: 4.58 Mil/uL (ref 3.87–5.11)
RDW: 12.4 % (ref 11.5–14.6)
WBC: 3.7 10*3/uL — ABNORMAL LOW (ref 4.5–10.5)

## 2019-01-24 LAB — COMPREHENSIVE METABOLIC PANEL
ALBUMIN: 4.5 g/dL (ref 3.5–5.2)
ALT: 17 U/L (ref 0–35)
AST: 16 U/L (ref 0–37)
Alkaline Phosphatase: 64 U/L (ref 39–117)
BUN: 10 mg/dL (ref 6–23)
CHLORIDE: 103 meq/L (ref 96–112)
CO2: 28 meq/L (ref 19–32)
Calcium: 9.7 mg/dL (ref 8.4–10.5)
Creatinine, Ser: 0.82 mg/dL (ref 0.40–1.20)
GFR: 106.69 mL/min (ref >60.00)
GLUCOSE: 75 mg/dL (ref 70–99)
POTASSIUM: 4.2 meq/L (ref 3.5–5.1)
SODIUM: 137 meq/L (ref 135–145)
Total Bilirubin: 0.5 mg/dL (ref 0.2–1.2)
Total Protein: 6.8 g/dL (ref 6.0–8.3)

## 2019-01-24 LAB — LIPID PANEL
Cholesterol: 170 mg/dL (ref 0–200)
HDL: 54.1 mg/dL (ref >39.00)
LDL Cholesterol: 105 mg/dL — ABNORMAL HIGH (ref 0–99)
NonHDL: 116.23
Total CHOL/HDL Ratio: 3
Triglycerides: 58 mg/dL (ref 0.0–149.0)
VLDL: 11.6 mg/dL (ref 0.0–40.0)

## 2019-01-24 LAB — TSH: TSH: 1.77 u[IU]/mL (ref 0.35–5.50)

## 2019-01-24 NOTE — Progress Notes (Signed)
I acted as a Neurosurgeon for [employer], PA-C Nancy Mull, LPN  Subjective:    Nancy Gay is a 23 y.o. female and is here for a comprehensive physical exam.  HPI  There are no preventive care reminders to display for this patient.  Acute Concerns: None  Chronic Issues: Anxiety -- last saw counselor in November. Was prescribed Zoloft 25 mg by me but never took it because she read that it causes "suicidal thoughts." She is doing well otherwise. Overall feels like her anxiety has been stable. Palpitations -- worked by cardiology for this in the fall, wore a Holter monitor and everything was negative. Does have intermittent episodes of palpitations but they are not significant. Denies chest pain, SOB. Asthma -- currently well controlled. On albuterol prn and flovent daily.  Health Maintenance: Immunizations -- UTD Colonoscopy -- N/A Mammogram -- N/A PAP -- Never Bone Density -- N/A Diet -- limited caloric beverages Caffeine intake -- none Sleep habits -- intermittent issues, has issues falling asleep Exercise -- been going to the gym and doing yoga Current Weight -- Weight: 149 lb 8 oz (67.8 kg)  Weight History: Wt Readings from Last 10 Encounters:  01/24/19 149 lb 8 oz (67.8 kg)  12/03/18 147 lb 12 oz (67 kg)  11/13/18 151 lb 3.2 oz (68.6 kg)  11/04/18 150 lb (68 kg)  10/29/18 148 lb 6.1 oz (67.3 kg)  10/29/18 151 lb 0.2 oz (68.5 kg)  10/23/18 151 lb (68.5 kg)  10/22/18 151 lb (68.5 kg)  10/09/18 151 lb 9.6 oz (68.8 kg)  10/08/18 148 lb 3.2 oz (67.2 kg)   Mood -- continues have issues with anxiety and depression Patient's last menstrual period was 01/15/2019.   Depression screen PHQ 2/9 01/24/2019  Decreased Interest 2  Down, Depressed, Hopeless 2  PHQ - 2 Score 4  Altered sleeping 2  Tired, decreased energy 2  Change in appetite 1  Feeling bad or failure about yourself  1  Trouble concentrating 1  Moving slowly or fidgety/restless 0  Suicidal  thoughts 0  PHQ-9 Score 11  Difficult doing work/chores Somewhat difficult     Other providers/specialists: Patient Care Team: Jarold Motto, [place] as PCP - General (Physician Assistant)   PMHx, SurgHx, SocialHx, Medications, and Allergies were reviewed in the Visit Navigator and updated as appropriate.   Past Medical History:  Diagnosis Date  . Allergy    dust mites, animal fur  . Asthma   . Heart palpitations     History reviewed. No pertinent surgical history.   Family History  Problem Relation Age of Onset  . Hypertension Mother   . Asthma Sister   . Asthma Brother     Social History   Tobacco Use  . Smoking status: Never Smoker  . Smokeless tobacco: Never Used  Substance Use Topics  . Alcohol use: No  . Drug use: No    Review of Systems:   Review of Systems  Constitutional: Negative for chills, fever, malaise/fatigue and weight loss.  HENT: Negative for hearing loss, sinus pain and sore throat.   Respiratory: Negative for cough and hemoptysis.   Cardiovascular: Positive for palpitations. Negative for chest pain, leg swelling and PND.  Gastrointestinal: Negative for abdominal pain, constipation, diarrhea, heartburn, nausea and vomiting.  Genitourinary: Negative for dysuria, frequency and urgency.  Musculoskeletal: Negative for back pain, myalgias and neck pain.  Skin: Negative for itching and rash.  Neurological: Negative for dizziness, tingling, seizures and headaches.  Endo/Heme/Allergies: Negative for  polydipsia.  Psychiatric/Behavioral: Positive for depression. The patient is nervous/anxious.     Objective:   BP 106/60 (BP Location: Left Arm, Patient Position: Sitting, Cuff Size: Normal)   Pulse 76   Temp 98.7 F (37.1 C) (Oral)   Ht 5\' 5"  (1.651 m)   Wt 149 lb 8 oz (67.8 kg)   LMP 01/15/2019   SpO2 99%   BMI 24.88 kg/m   General Appearance:    Alert, cooperative, no distress, appears stated age  Head:    Normocephalic, without obvious  abnormality, atraumatic  Eyes:    PERRL, conjunctiva/corneas clear, EOM's intact, fundi    benign, both eyes  Ears:    Normal TM's and external ear canals, both ears  Nose:   Nares normal, septum midline, mucosa normal, no drainage    or sinus tenderness  Throat:   Lips, mucosa, and tongue normal; teeth and gums normal  Neck:   Supple, symmetrical, trachea midline, no adenopathy;    thyroid:  no enlargement/tenderness/nodules; no carotid   bruit or JVD  Back:     Symmetric, no curvature, ROM normal, no CVA tenderness  Lungs:     Clear to auscultation bilaterally, respirations unlabored  Chest Wall:    No tenderness or deformity   Heart:    Regular rate and rhythm, S1 and S2 normal, no murmur, rub   or gallop  Breast Exam:    Deferred  Abdomen:     Soft, non-tender, bowel sounds active all four quadrants,    no masses, no organomegaly  Genitalia:    Deferred  Rectal:    Deferred  Extremities:   Extremities normal, atraumatic, no cyanosis or edema  Pulses:   2+ and symmetric all extremities  Skin:   Skin color, texture, turgor normal, no rashes or lesions  Lymph nodes:   Cervical, supraclavicular, and axillary nodes normal  Neurologic:   CNII-XII intact, normal strength, sensation and reflexes    throughout     Assessment/Plan:   Nancy Gay was seen today for annual exam.  Diagnoses and all orders for this visit:  Routine physical examination Today patient counseled on age appropriate routine health concerns for screening and prevention, each reviewed and up to date or declined. Immunizations reviewed and up to date or declined. Labs ordered and reviewed. Risk factors for depression reviewed and negative. Hearing function and visual acuity are intact. ADLs screened and addressed as needed. Functional ability and level of safety reviewed and appropriate. Education, counseling and referrals performed based on assessed risks today. Patient provided with a copy of personalized plan for  preventive services.  Anxiety Long discussion regarding this. She is going to try to start seeing her counselor more regularly. Additionally, she is considering starting Zoloft 25 mg while at home for Spring Break in early March. Follow-up in 3 months. Currently without SI/HI. -     CBC with Differential/Platelet -     Comprehensive metabolic panel -     TSH  Mild intermittent asthma without complication Well controlled.  Palpitations Overall well controlled.  Encounter for lipid screening for cardiovascular disease -     Lipid panel    Well Adult Exam: Labs ordered: Yes. Patient counseling was done. See below for items discussed. Discussed the patient's BMI. The BMI is in the acceptable range Follow up as needed for acute illness.  Patient Counseling:   [x]     Nutrition: Stressed importance of moderation in sodium/caffeine intake, saturated fat and cholesterol, caloric balance, sufficient intake of  fresh fruits, vegetables, fiber, calcium, iron, and 1 mg of folate supplement per day (for females capable of pregnancy).   [x]      Stressed the importance of regular exercise.    [x]     Substance Abuse: Discussed cessation/primary prevention of tobacco, alcohol, or other drug use; driving or other dangerous activities under the influence; availability of treatment for abuse.    [x]      Injury prevention: Discussed safety belts, safety helmets, smoke detector, smoking near bedding or upholstery.    [x]      Sexuality: Discussed sexually transmitted diseases, partner selection, use of condoms, avoidance of unintended pregnancy  and contraceptive alternatives.    [x]     Dental health: Discussed importance of regular tooth brushing, flossing, and dental visits.   [x]      Health maintenance and immunizations reviewed. Please refer to Health maintenance section.   CMA or LPN served as scribe during this visit. History, Physical, and Plan performed by medical provider. The above  documentation has been reviewed and is accurate and complete.  Jarold MottoSamantha Latreece Mochizuki, PA-C [facility]

## 2019-01-24 NOTE — Patient Instructions (Signed)
It was great to see you!  Please go to the lab for blood work.   Our office will call you with your results unless you have chosen to receive results via MyChart.  If your blood work is normal we will follow-up each year for physicals and as scheduled for chronic medical problems.  If anything is abnormal we will treat accordingly and get you in for a follow-up.  Take care,  [facility] Maintenance, Female Adopting a healthy lifestyle and getting preventive care can go a long way to promote health and wellness. Talk with your health care provider about what schedule of regular examinations is right for you. This is a good chance for you to check in with your provider about disease prevention and staying healthy. In between checkups, there are plenty of things you can do on your own. Experts have done a lot of research about which lifestyle changes and preventive measures are most likely to keep you healthy. Ask your health care provider for more information. Weight and diet Eat a healthy diet  Be sure to include plenty of vegetables, fruits, low-fat dairy products, and lean protein.  Do not eat a lot of foods high in solid fats, added sugars, or salt.  Get regular exercise. This is one of the most important things you can do for your health. ? Most adults should exercise for at least 150 minutes each week. The exercise should increase your heart rate and make you sweat (moderate-intensity exercise). ? Most adults should also do strengthening exercises at least twice a week. This is in addition to the moderate-intensity exercise. Maintain a healthy weight  Body mass index (BMI) is a measurement that can be used to identify possible weight problems. It estimates body fat based on height and weight. Your health care provider can help determine your BMI and help you achieve or maintain a healthy weight.  For females 55 years of age and older: ? A BMI below 18.5 is considered  underweight. ? A BMI of 18.5 to 24.9 is normal. ? A BMI of 25 to 29.9 is considered overweight. ? A BMI of 30 and above is considered obese. Watch levels of cholesterol and blood lipids  You should start having your blood tested for lipids and cholesterol at 21 years of age, then have this test every 5 years.  You may need to have your cholesterol levels checked more often if: ? Your lipid or cholesterol levels are high. ? You are older than 21 years of age. ? You are at high risk for heart disease. Cancer screening Lung Cancer  Lung cancer screening is recommended for adults 60-73 years old who are at high risk for lung cancer because of a history of smoking.  A yearly low-dose CT scan of the lungs is recommended for people who: ? Currently smoke. ? Have quit within the past 15 years. ? Have at least a 30-pack-year history of smoking. A pack year is smoking an average of one pack of cigarettes a day for 1 year.  Yearly screening should continue until it has been 15 years since you quit.  Yearly screening should stop if you develop a health problem that would prevent you from having lung cancer treatment. Breast Cancer  Practice breast self-awareness. This means understanding how your breasts normally appear and feel.  It also means doing regular breast self-exams. Let your health care provider know about any changes, no matter how small.  If you are in  your 20s or 30s, you should have a clinical breast exam (CBE) by a health care provider every 1-3 years as part of a regular health exam.  If you are 40 or older, have a CBE every year. Also consider having a breast X-ray (mammogram) every year.  If you have a family history of breast cancer, talk to your health care provider about genetic screening.  If you are at high risk for breast cancer, talk to your health care provider about having an MRI and a mammogram every year.  Breast cancer gene (BRCA) assessment is recommended  for women who have family members with BRCA-related cancers. BRCA-related cancers include: ? Breast. ? Ovarian. ? Tubal. ? Peritoneal cancers.  Results of the assessment will determine the need for genetic counseling and BRCA1 and BRCA2 testing. Cervical Cancer Your health care provider may recommend that you be screened regularly for cancer of the pelvic organs (ovaries, uterus, and vagina). This screening involves a pelvic examination, including checking for microscopic changes to the surface of your cervix (Pap test). You may be encouraged to have this screening done every 3 years, beginning at age 21.  For women ages 30-65, health care providers may recommend pelvic exams and Pap testing every 3 years, or they may recommend the Pap and pelvic exam, combined with testing for human papilloma virus (HPV), every 5 years. Some types of HPV increase your risk of cervical cancer. Testing for HPV may also be done on women of any age with unclear Pap test results.  Other health care providers may not recommend any screening for nonpregnant women who are considered low risk for pelvic cancer and who do not have symptoms. Ask your health care provider if a screening pelvic exam is right for you.  If you have had past treatment for cervical cancer or a condition that could lead to cancer, you need Pap tests and screening for cancer for at least 20 years after your treatment. If Pap tests have been discontinued, your risk factors (such as having a new sexual partner) need to be reassessed to determine if screening should resume. Some women have medical problems that increase the chance of getting cervical cancer. In these cases, your health care provider may recommend more frequent screening and Pap tests. Colorectal Cancer  This type of cancer can be detected and often prevented.  Routine colorectal cancer screening usually begins at 21 years of age and continues through 21 years of age.  Your health  care provider may recommend screening at an earlier age if you have risk factors for colon cancer.  Your health care provider may also recommend using home test kits to check for hidden blood in the stool.  A small camera at the end of a tube can be used to examine your colon directly (sigmoidoscopy or colonoscopy). This is done to check for the earliest forms of colorectal cancer.  Routine screening usually begins at age 50.  Direct examination of the colon should be repeated every 5-10 years through 21 years of age. However, you may need to be screened more often if early forms of precancerous polyps or small growths are found. Skin Cancer  Check your skin from head to toe regularly.  Tell your health care provider about any new moles or changes in moles, especially if there is a change in a mole's shape or color.  Also tell your health care provider if you have a mole that is larger than the size of a pencil   eraser.  Always use sunscreen. Apply sunscreen liberally and repeatedly throughout the day.  Protect yourself by wearing long sleeves, pants, a wide-brimmed hat, and sunglasses whenever you are outside. Heart disease, diabetes, and high blood pressure  High blood pressure causes heart disease and increases the risk of stroke. High blood pressure is more likely to develop in: ? People who have blood pressure in the high end of the normal range (130-139/85-89 mm Hg). ? People who are overweight or obese. ? People who are African American.  If you are 68-37 years of age, have your blood pressure checked every 3-5 years. If you are 48 years of age or older, have your blood pressure checked every year. You should have your blood pressure measured twice-once when you are at a hospital or clinic, and once when you are not at a hospital or clinic. Record the average of the two measurements. To check your blood pressure when you are not at a hospital or clinic, you can use: ? An automated  blood pressure machine at a pharmacy. ? A home blood pressure monitor.  If you are between 92 years and 57 years old, ask your health care provider if you should take aspirin to prevent strokes.  Have regular diabetes screenings. This involves taking a blood sample to check your fasting blood sugar level. ? If you are at a normal weight and have a low risk for diabetes, have this test once every three years after 21 years of age. ? If you are overweight and have a high risk for diabetes, consider being tested at a younger age or more often. Preventing infection Hepatitis B  If you have a higher risk for hepatitis B, you should be screened for this virus. You are considered at high risk for hepatitis B if: ? You were born in a country where hepatitis B is common. Ask your health care provider which countries are considered high risk. ? Your parents were born in a high-risk country, and you have not been immunized against hepatitis B (hepatitis B vaccine). ? You have HIV or AIDS. ? You use needles to inject street drugs. ? You live with someone who has hepatitis B. ? You have had sex with someone who has hepatitis B. ? You get hemodialysis treatment. ? You take certain medicines for conditions, including cancer, organ transplantation, and autoimmune conditions. Hepatitis C  Blood testing is recommended for: ? Everyone born from [birth year] through 1963. ? Anyone with known risk factors for hepatitis C. Sexually transmitted infections (STIs)  You should be screened for sexually transmitted infections (STIs) including gonorrhea and chlamydia if: ? You are sexually active and are younger than 21 years of age. ? You are older than 21 years of age and your health care provider tells you that you are at risk for this type of infection. ? Your sexual activity has changed since you were last screened and you are at an increased risk for chlamydia or gonorrhea. Ask your health care provider if you are at  risk.  If you do not have HIV, but are at risk, it may be recommended that you take a prescription medicine daily to prevent HIV infection. This is called pre-exposure prophylaxis (PrEP). You are considered at risk if: ? You are sexually active and do not regularly use condoms or know the HIV status of your partner(s). ? You take drugs by injection. ? You are sexually active with a partner who has HIV. Talk with your health  care provider about whether you are at high risk of being infected with HIV. If you choose to begin PrEP, you should first be tested for HIV. You should then be tested every 3 months for as long as you are taking PrEP. Pregnancy  If you are premenopausal and you may become pregnant, ask your health care provider about preconception counseling.  If you may become pregnant, take 400 to 800 micrograms (mcg) of folic acid every day.  If you want to prevent pregnancy, talk to your health care provider about birth control (contraception). Osteoporosis and menopause  Osteoporosis is a disease in which the bones lose minerals and strength with aging. This can result in serious bone fractures. Your risk for osteoporosis can be identified using a bone density scan.  If you are 65 years of age or older, or if you are at risk for osteoporosis and fractures, ask your health care provider if you should be screened.  Ask your health care provider whether you should take a calcium or vitamin D supplement to lower your risk for osteoporosis.  Menopause may have certain physical symptoms and risks.  Hormone replacement therapy may reduce some of these symptoms and risks. Talk to your health care provider about whether hormone replacement therapy is right for you. Follow these instructions at home:  Schedule regular health, dental, and eye exams.  Stay current with your immunizations.  Do not use any tobacco products including cigarettes, chewing tobacco, or electronic  cigarettes.  If you are pregnant, do not drink alcohol.  If you are breastfeeding, limit how much and how often you drink alcohol.  Limit alcohol intake to no more than 1 drink per day for nonpregnant women. One drink equals 12 ounces of beer, 5 ounces of wine, or 1 ounces of hard liquor.  Do not use street drugs.  Do not share needles.  Ask your health care provider for help if you need support or information about quitting drugs.  Tell your health care provider if you often feel depressed.  Tell your health care provider if you have ever been abused or do not feel safe at home. This information is not intended to replace advice given to you by your health care provider. Make sure you discuss any questions you have with your health care provider. Document Released: 06/26/2011 Document Revised: 05/18/2016 Document Reviewed: 09/14/2015 Elsevier Interactive Patient Education  2019 [employer]  

## 2019-02-05 ENCOUNTER — Encounter (HOSPITAL_COMMUNITY): Payer: Self-pay | Admitting: Emergency Medicine

## 2019-02-05 ENCOUNTER — Other Ambulatory Visit: Payer: Self-pay

## 2019-02-05 ENCOUNTER — Encounter: Payer: Self-pay | Admitting: Physician Assistant

## 2019-02-05 ENCOUNTER — Emergency Department (HOSPITAL_COMMUNITY)
Admission: EM | Admit: 2019-02-05 | Discharge: 2019-02-06 | Disposition: A | Discharge status: Home / Self Care | Payer: Medicaid Other | Attending: Emergency Medicine | Admitting: Emergency Medicine

## 2019-02-05 DIAGNOSIS — Z79899 Other long term (current) drug therapy: Secondary | ICD-10-CM | POA: Insufficient documentation

## 2019-02-05 DIAGNOSIS — J45909 Unspecified asthma, uncomplicated: Secondary | ICD-10-CM | POA: Insufficient documentation

## 2019-02-05 DIAGNOSIS — Z880 Allergy status to penicillin: Secondary | ICD-10-CM | POA: Diagnosis not present

## 2019-02-05 DIAGNOSIS — R002 Palpitations: Secondary | ICD-10-CM

## 2019-02-05 NOTE — ED Triage Notes (Signed)
Pt arriving with complaint of increased heart rate. Pt reports she has asthma and anxiety. Pt states she has had this happen before but her palpitations are much worse today. No other complaints at this time.

## 2019-02-06 NOTE — ED Notes (Signed)
Pt verbalized discharge instructions and follow up care. Alert and ambulatory  

## 2019-02-06 NOTE — ED Provider Notes (Signed)
[facility] DEPT Provider Note: Lowella DellJ. Lane Marsella Suman, MD, FACEP  CSN: [id number] MRN: [id number] ARRIVAL: 02/05/19 at 2152 ROOM: WA13/WA13   CHIEF COMPLAINT  Palpitations   HISTORY OF PRESENT ILLNESS  02/06/19 12:19 AM Nancy Gay is a 23 y.o. female who is been having palpitations since last October.  The palpitations are described as a sensation of her heart beating rapidly.  She was evaluated by a cardiologist, including the wearing of a monitor, without an abnormality found.  Her symptoms were attributed to anxiety and she was started on hydroxyzine as needed.  She is no longer taking this.  Her PCP intends to start her on Zoloft soon but she is not currently taking it.  She is here tonight because for the past several days she has had a different type of palpitation.  She describes it as a sensation of a skipped beat.  These happen several times a day.  She has been avoiding caffeine and other medications.  She does have asthma and does use albuterol as needed.  She is not having chest pain, shortness of breath, nausea, vomiting or diarrhea.   Past Medical History:  Diagnosis Date  . Allergy    dust mites, animal fur  . Asthma   . Heart palpitations     History reviewed. No pertinent surgical history.  Family History  Problem Relation Age of Onset  . Hypertension Mother   . Asthma Sister   . Asthma Brother     Social History   Tobacco Use  . Smoking status: Never Smoker  . Smokeless tobacco: Never Used  Substance Use Topics  . Alcohol use: No  . Drug use: No    Prior to Admission medications   Medication Sig Start Date End Date Taking? Authorizing Provider  albuterol (PROVENTIL HFA;VENTOLIN HFA) 108 (90 Base) MCG/ACT inhaler Inhale 2 puffs into the lungs every 6 (six) hours as needed for wheezing or shortness of breath. 11/20/18  Yes Worley, Lelon MastSamantha, PA  albuterol (PROVENTIL) (2.5 MG/3ML) 0.083% nebulizer solution TAKE 3 MLS BY NEBULIZATION EVERY 6 HOURS AS  NEEDED FOR WHEEZING OR SHORTNESS OF BREATH Patient taking differently: Take 2.5 mg by nebulization every 6 (six) hours as needed for shortness of breath.  05/02/18  Yes Worley, Lelon MastSamantha, PA  cetirizine (ZYRTEC) 10 MG tablet TAKE 1 TABLET BY MOUTH DAILY 09/09/18  Yes Shelva MajesticHunter, Stephen O, MD  diphenhydrAMINE (BENADRYL) 25 mg capsule Take 25 mg by mouth every 6 (six) hours as needed for itching.   Yes [provider]  FLOVENT HFA 110 MCG/ACT inhaler INHALE 1 PUFF INTO THE LUNGS TWICE DAILY Patient taking differently: Inhale 1 puff into the lungs 2 (two) times daily.  11/20/18  Yes Worley, Lelon MastSamantha, PA  montelukast (SINGULAIR) 10 MG tablet TAKE 1 TABLET(10 MG) BY MOUTH AT BEDTIME Patient taking differently: Take 10 mg by mouth at bedtime.  05/02/18  Yes Jarold MottoWorley, Samantha, PA    Allergies Amoxicillin and Penicillins   REVIEW OF SYSTEMS  Negative except as noted here or in the History of Present Illness.   PHYSICAL EXAMINATION  Initial Vital Signs Blood pressure 120/83, pulse 95, temperature 98.2 F (36.8 C), temperature source Oral, resp. rate 14, height 5\' 4"  (1.626 m), weight 67.6 kg, last menstrual period 01/15/2019, SpO2 100 %.  Examination General: Well-developed, well-nourished female in no acute distress; appearance consistent with age of record HENT: normocephalic; atraumatic Eyes: pupils equal, round and reactive to light; extraocular muscles intact Neck: supple Heart: regular rate and rhythm Lungs:  clear to auscultation bilaterally Abdomen: soft; nondistended; nontender; bowel sounds present Extremities: No deformity; full range of motion; pulses normal Neurologic: Awake, alert and oriented; motor function intact in all extremities and symmetric; no facial droop Skin: Warm and dry Psychiatric: Normal mood and affect   RESULTS  Summary of this visit's results, reviewed by myself:   EKG Interpretation  Date/Time:  Wednesday February 05 2019 22:04:55 EST Ventricular  Rate:  90 PR Interval:    QRS Duration: 92 QT Interval:  361 QTC Calculation: 442 R Axis:   82 Text Interpretation:  Sinus rhythm Normal ECG Rate is slower Confirmed by Jezel Basto ([id number]) on 02/05/2019 11:11:45 PM      Laboratory Studies: No results found for this or any previous visit (from the past 24 hour(s)). Imaging Studies: No results found.  ED COURSE and MDM  Nursing notes and initial vitals signs, including pulse oximetry, reviewed.  Vitals:   02/05/19 2156 02/05/19 2306 02/05/19 2307  BP: (!) 143/85 120/83 120/83  Pulse: 95    Resp: 19  14  Temp: 98.2 F (36.8 C)    TempSrc: Oral    SpO2: 100% 100% 100%  Weight: 67.6 kg    Height: 5\' 4"  (1.626 m)     The patient's monitor strip was reviewed for the time she has been in the ED.  No ectopic beats or other arrhythmia was seen.  Her symptoms sound like PVCs.  She was told that these are not dangerous but she may wish to follow back up with her cardiologist.  PROCEDURES    ED DIAGNOSES     ICD-10-CM   1. Palpitations R00.2        Asami Lambright, Jonny Ruiz, MD 02/06/19 [phone]

## 2019-03-30 ENCOUNTER — Other Ambulatory Visit: Payer: Self-pay | Admitting: Physician Assistant

## 2019-04-21 ENCOUNTER — Other Ambulatory Visit: Payer: Self-pay | Admitting: Physician Assistant

## 2019-04-21 ENCOUNTER — Telehealth: Payer: Self-pay | Admitting: *Deleted

## 2019-04-21 NOTE — Telephone Encounter (Signed)
Left message on voicemail to call office. Needs to schedule virtual visit with [facility] for follow up.

## 2019-04-23 ENCOUNTER — Other Ambulatory Visit: Payer: Self-pay | Admitting: Physician Assistant

## 2019-04-23 ENCOUNTER — Encounter: Payer: Self-pay | Admitting: Physician Assistant

## 2019-04-23 MED ORDER — SERTRALINE HCL 25 MG PO TABS: 25.0000 mg | ORAL_TABLET | Freq: Every day | ORAL | 1 refills | Status: DC

## 2019-04-25 ENCOUNTER — Ambulatory Visit: Payer: Medicaid Other | Admitting: Physician Assistant

## 2019-05-11 ENCOUNTER — Other Ambulatory Visit: Payer: Self-pay | Admitting: Physician Assistant

## 2019-05-20 ENCOUNTER — Encounter: Payer: Self-pay | Admitting: Physician Assistant

## 2019-05-22 ENCOUNTER — Ambulatory Visit (INDEPENDENT_AMBULATORY_CARE_PROVIDER_SITE_OTHER): Payer: Medicaid Other | Admitting: Physician Assistant

## 2019-05-22 ENCOUNTER — Encounter: Payer: Self-pay | Admitting: Physician Assistant

## 2019-05-22 VITALS — Ht 64.0 in | Wt 145.0 lb

## 2019-05-22 DIAGNOSIS — F419 Anxiety disorder, unspecified: Secondary | ICD-10-CM

## 2019-05-22 MED ORDER — SERTRALINE HCL 25 MG PO TABS: 25.0000 mg | ORAL_TABLET | Freq: Every day | ORAL | 1 refills | Status: DC

## 2019-05-22 NOTE — Progress Notes (Signed)
Virtual Visit via Video   I connected with Nancy Gay on 05/22/19 at 10:20 AM EDT by a video enabled telemedicine application and verified that I am speaking with the correct person using two identifiers. Location patient: Home Location provider: [facility], Office Persons participating in the virtual visit: Margeaux, Massie PA-C, Corky Mull, LPN   I discussed the limitations of evaluation and management by telemedicine and the availability of in person appointments. The patient expressed understanding and agreed to proceed.  I acted as a Neurosurgeon for [employer], PA-C [employer], LPN  Subjective:   HPI:  Anxiety Pt following up today, about anxiety medication, pt currently taking zoloft 25mg . Pt stated she has no issues with medication and has been able to control anxiety. She feels like this medication is working very well for and denies any side effects. Sleeping well. No panic attacks. No SI/HI.  GAD 7 : Generalized Anxiety Score 05/22/2019 01/24/2019 12/03/2018 10/29/2018  Nervous, Anxious, on Edge 1 2 1 1   Control/stop worrying 0 2 1 3   Worry too much - different things 1 2 2 2   Trouble relaxing 0 2 3 3   Restless 0 2 0 3  Easily annoyed or irritable 0 2 1 1   Afraid - awful might happen 0 2 2 3   Total GAD 7 Score 2 14 10 16   Anxiety Difficulty Not difficult at all Somewhat difficult Very difficult Very difficult      ROS: See pertinent positives and negatives per HPI.  Patient Active Problem List   Diagnosis Date Noted  . Anxiety 01/24/2019  . Asthma 10/29/2018  . Palpitations 10/09/2018    Social History   Tobacco Use  . Smoking status: Never Smoker  . Smokeless tobacco: Never Used  Substance Use Topics  . Alcohol use: No    Current Outpatient Medications:  .  albuterol (PROVENTIL HFA;VENTOLIN HFA) 108 (90 Base) MCG/ACT inhaler, Inhale 2 puffs into the lungs every 6 (six) hours as needed for wheezing or shortness of breath.,  Disp: 1 Inhaler, Rfl: 5 .  albuterol (PROVENTIL) (2.5 MG/3ML) 0.083% nebulizer solution, TAKE 3 MLS BY NEBULIZATION EVERY 6 HOURS AS NEEDED FOR WHEEZING OR SHORTNESS OF BREATH (Patient taking differently: Take 2.5 mg by nebulization every 6 (six) hours as needed for shortness of breath. ), Disp: 75 mL, Rfl: 1 .  cetirizine (ZYRTEC) 10 MG tablet, TAKE 1 TABLET BY MOUTH DAILY, Disp: 90 tablet, Rfl: 1 .  diphenhydrAMINE (BENADRYL) 25 mg capsule, Take 25 mg by mouth every 6 (six) hours as needed for itching., Disp: , Rfl:  .  FLOVENT HFA 110 MCG/ACT inhaler, INHALE 1 PUFF INTO THE LUNGS TWICE DAILY, Disp: 12 g, Rfl: 0 .  montelukast (SINGULAIR) 10 MG tablet, TAKE 1 TABLET BY MOUTH EVERY NIGHT AT BEDTIME, Disp: 90 tablet, Rfl: 0 .  sertraline (ZOLOFT) 25 MG tablet, Take 1 tablet (25 mg total) by mouth daily., Disp: 90 tablet, Rfl: 1  Allergies  Allergen Reactions  . Amoxicillin Shortness Of Breath    Has patient had a PCN reaction causing immediate rash, facial/tongue/throat swelling, SOB or lightheadedness with hypotension: Yes Has patient had a PCN reaction causing severe rash involving mucus membranes or skin necrosis: No Has patient had a PCN reaction that required hospitalization: Yes Has patient had a PCN reaction occurring within the last 10 years: Yes If all of the above answers are "NO", then may proceed with Cephalosporin use.   Marland Kitchen Penicillins Shortness Of Breath  Has patient had a PCN reaction causing immediate rash, facial/tongue/throat swelling, SOB or lightheadedness with hypotension: No Has patient had a PCN reaction causing severe rash involving mucus membranes or skin necrosis: Yes Has patient had a PCN reaction that required hospitalization: Yes Has patient had a PCN reaction occurring within the last 10 years: Yes If all of the above answers are "NO", then may proceed with Cephalosporin use.     Objective:   VITALS: Per patient if applicable, see vitals. GENERAL: Alert,  appears well and in no acute distress. HEENT: Atraumatic, conjunctiva clear, no obvious abnormalities on inspection of external nose and ears. NECK: Normal movements of the head and neck. CARDIOPULMONARY: No increased WOB. Speaking in clear sentences. I:E ratio WNL.  MS: Moves all visible extremities without noticeable abnormality. PSYCH: Pleasant and cooperative, well-groomed. Speech normal rate and rhythm. Affect is appropriate. Insight and judgement are appropriate. Attention is focused, linear, and appropriate.  NEURO: CN grossly intact. Oriented as arrived to appointment on time with no prompting. Moves both UE equally.  SKIN: No obvious lesions, wounds, erythema, or cyanosis noted on face or hands.  Assessment and Plan:   Nancy Gay was seen today for anxiety.  Diagnoses and all orders for this visit:  Anxiety Controlled. Refill Zoloft 25 mg x 6 months. Follow-up sooner if any issues.  Other orders -     sertraline (ZOLOFT) 25 MG tablet; Take 1 tablet (25 mg total) by mouth daily.    . Reviewed expectations re: course of current medical issues. . Discussed self-management of symptoms. . Outlined signs and symptoms indicating need for more acute intervention. . Patient verbalized understanding and all questions were answered. Marland Kitchen. Health Maintenance issues including appropriate healthy diet, exercise, and smoking avoidance were discussed with patient. . See orders for this visit as documented in the electronic medical record.  I discussed the assessment and treatment plan with the patient. The patient was provided an opportunity to ask questions and all were answered. The patient agreed with the plan and demonstrated an understanding of the instructions.   The patient was advised to call back or seek an in-person evaluation if the symptoms worsen or if the condition fails to improve as anticipated.   CMA or LPN served as scribe during this visit. History, Physical, and Plan performed  by medical provider. The above documentation has been reviewed and is accurate and complete.   HemlockSamantha Gay, GeorgiaPA 05/22/2019

## 2019-07-29 ENCOUNTER — Other Ambulatory Visit: Payer: Self-pay | Admitting: Physician Assistant

## 2019-08-22 ENCOUNTER — Encounter: Payer: Self-pay | Admitting: Physician Assistant

## 2019-08-22 NOTE — Telephone Encounter (Signed)
Nancy Gay, can you look into this.

## 2019-08-22 NOTE — Telephone Encounter (Signed)
Dawn, can you look into this for me? I am not sure what a taxonomy code is.

## 2019-10-13 ENCOUNTER — Other Ambulatory Visit: Payer: Self-pay | Admitting: Physician Assistant

## 2019-10-13 ENCOUNTER — Other Ambulatory Visit: Payer: Self-pay | Admitting: Family Medicine

## 2019-10-15 ENCOUNTER — Encounter: Payer: Self-pay | Admitting: Physician Assistant

## 2019-10-16 ENCOUNTER — Ambulatory Visit (INDEPENDENT_AMBULATORY_CARE_PROVIDER_SITE_OTHER): Payer: Medicaid Other | Admitting: Family Medicine

## 2019-10-16 ENCOUNTER — Encounter: Payer: Self-pay | Admitting: Family Medicine

## 2019-10-16 VITALS — Ht 64.0 in | Wt 147.0 lb

## 2019-10-16 DIAGNOSIS — Z113 Encounter for screening for infections with a predominantly sexual mode of transmission: Secondary | ICD-10-CM | POA: Diagnosis not present

## 2019-10-16 NOTE — Progress Notes (Signed)
Patient: Nancy Gay MRN: [id number] DOB: [birth year] PCP: Nancy Coke, PA     I connected with Nancy Gay on 10/16/19 at 11:13am by a video enabled telemedicine application and verified that I am speaking with the correct person using two identifiers.  Location patient: Home Location provider: [facility], Office Persons participating in this virtual visit: Nancy Gay and Nancy Gay   I discussed the limitations of evaluation and management by telemedicine and the availability of in person appointments. The patient expressed understanding and agreed to proceed.   Subjective:  Chief Complaint  Patient presents with  . Exposure to STD    HPI: The patient is a 23 y.o. female who presents today for concerns regarding STD. She did the urine test only and did not do any blood work. She states it started around April of this year. She was a virgin and didn't find out until May or June when her partner called and said he had slept with someone that had chlamydia. He went and got tested and told her he was positive for chlamydia, but rest of his blood work was negative. She also had a positive chlamydia test and was treated. This was all back in June or July. She is wanting to know if she still could have something.  She is asymptomatic. She is living in [place]. She wants to know where she can go since she is not in [place]. Not currently sexually active.   Review of Systems  Respiratory: Negative for shortness of breath.   Cardiovascular: Negative for chest pain.  Gastrointestinal: Negative for abdominal pain, diarrhea, nausea and vomiting.  Genitourinary: Negative for difficulty urinating, dysuria, flank pain, frequency, genital sores, pelvic pain, urgency, vaginal bleeding, vaginal discharge and vaginal pain.  Musculoskeletal: Negative for back pain.    Allergies Patient is allergic to amoxicillin and penicillins.  Past Medical History Patient  has a past medical  history of Allergy, Asthma, and Heart palpitations.  Surgical History Patient  has no past surgical history on file.  Family History Pateint's family history includes Asthma in her brother and sister; Hypertension in her mother.  Social History Patient  reports that she has never smoked. She has never used smokeless tobacco. She reports that she does not drink alcohol or use drugs.    Objective: Vitals:   10/16/19 1103  Weight: 147 lb (66.7 kg)  Height: 5\' 4"  (1.626 m)    Body mass index is 25.23 kg/m.  Physical Exam Vitals signs reviewed.  Constitutional:      Appearance: Normal appearance.  HENT:     Head: Normocephalic and atraumatic.  Pulmonary:     Effort: Pulmonary effort is normal.  Neurological:     General: No focal deficit present.     Mental Status: She is alert and oriented to person, place, and time.        Assessment/plan: Screen for STD (sexually transmitted disease) -recommended a TOC and needs HIV screen since never had done since became sexually active. Would also check other blood std pathogens which are part of routine screening. Discussed these with her.  She is not in [place] and will go to urgent care in [place]. She would like to f/u with me regarding results. Safe sex/abstinence discussed.    Return if symptoms worsen or fail to improve.    Nancy Flaming, MD [place]  10/16/2019

## 2019-10-16 NOTE — Patient Instructions (Signed)

## 2019-10-16 NOTE — Telephone Encounter (Signed)
Pt seeing Dr. Rogers Blocker at 11:00 today virtually.

## 2019-12-04 ENCOUNTER — Other Ambulatory Visit: Payer: Self-pay | Admitting: Physician Assistant

## 2019-12-10 ENCOUNTER — Other Ambulatory Visit: Payer: Self-pay | Admitting: Physician Assistant

## 2020-01-08 ENCOUNTER — Other Ambulatory Visit: Payer: Self-pay | Admitting: Physician Assistant

## 2020-01-20 IMAGING — CR DG CHEST 2V
2 series · 2 of 2 positions shown · non-contrast
Comparison: 08/30/2017

CLINICAL DATA: Tachycardia.

EXAM:
CHEST - 2 VIEW

[w chest pa]
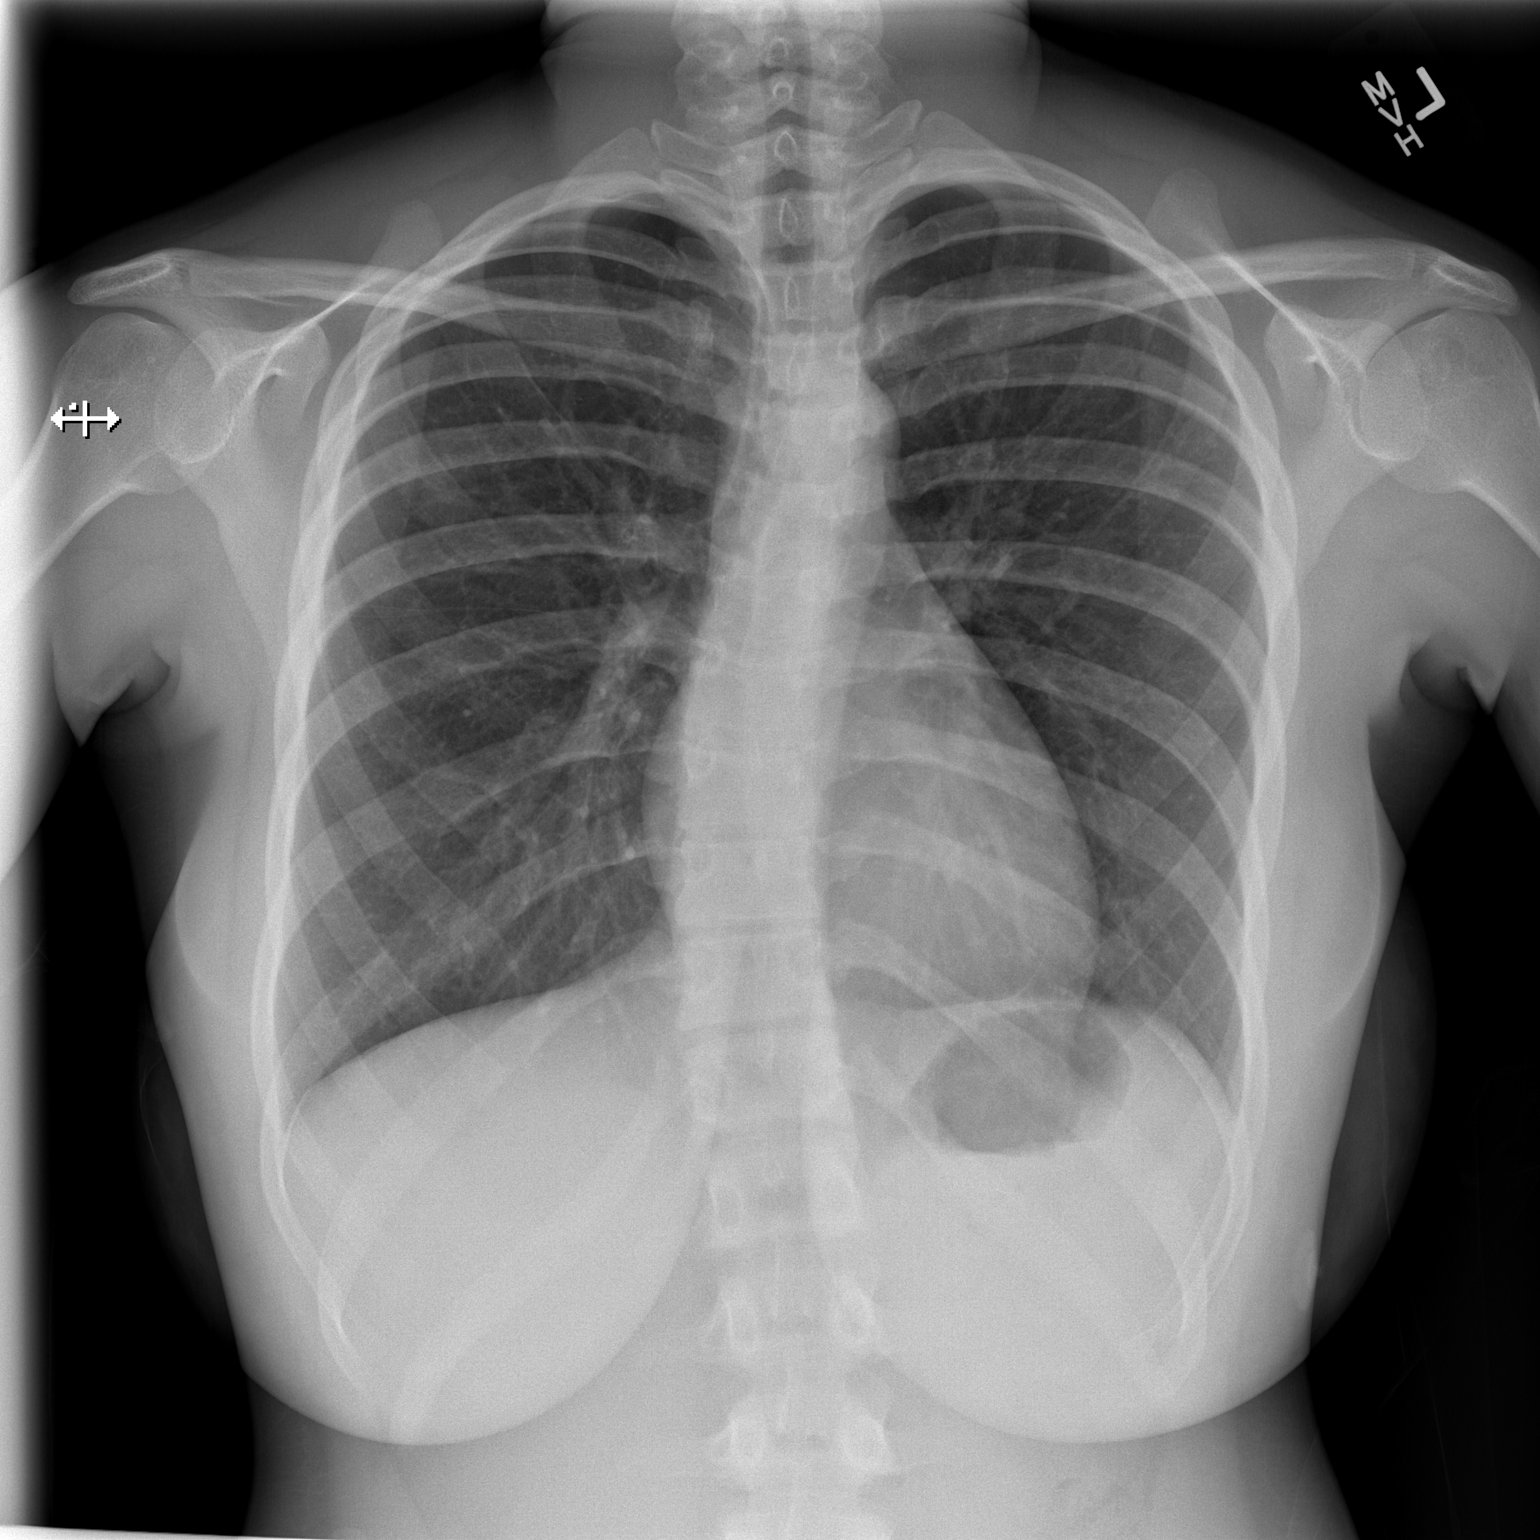

[w chest lat]
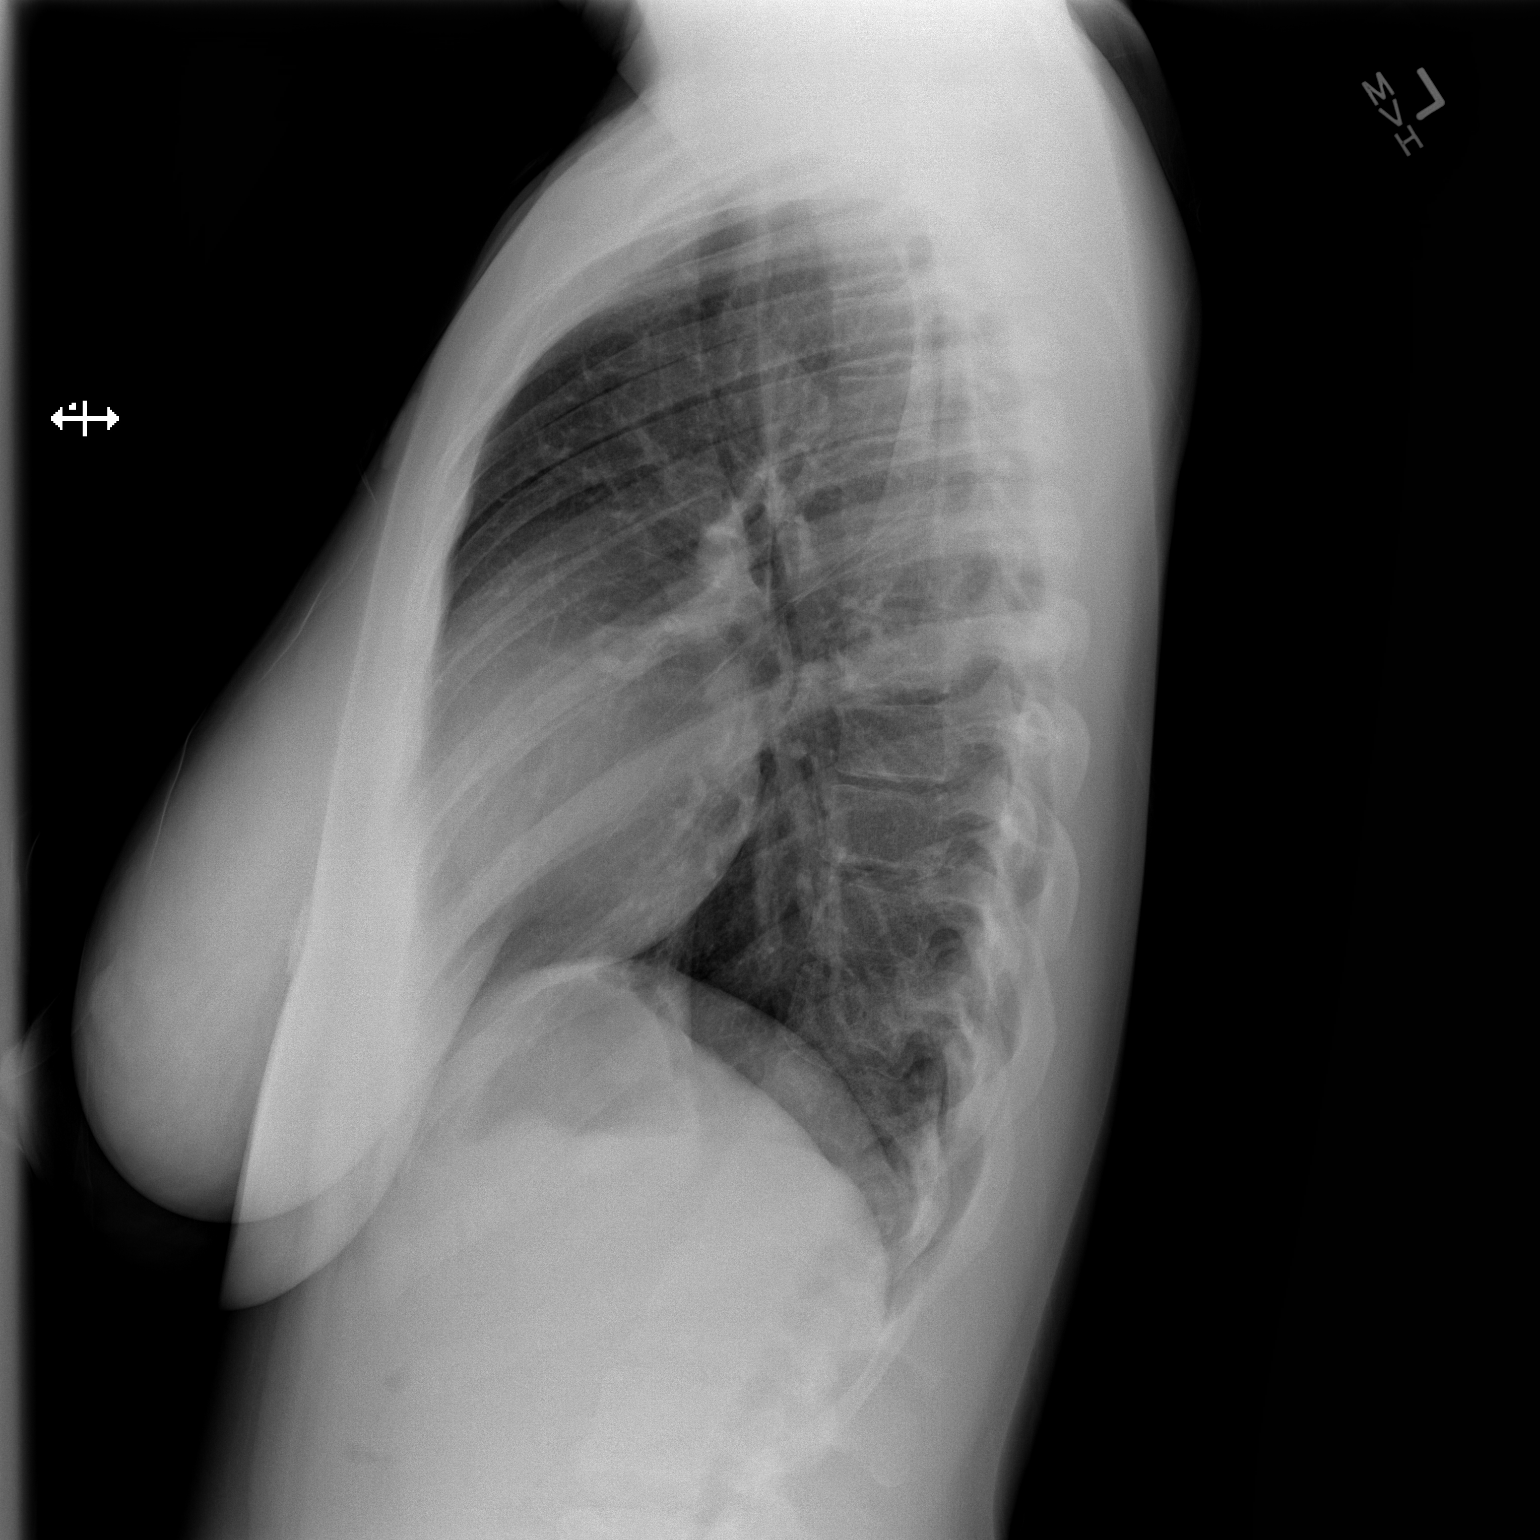

[2 of 2 positions shown; findings below may reference images not displayed]

FINDINGS: Lungs are adequately inflated and otherwise clear. Cardiomediastinal
silhouette and remainder of the exam is unchanged.
IMPRESSION: No active cardiopulmonary disease.

## 2020-01-26 IMAGING — CR DG CHEST 2V
2 series · 2 of 2 positions shown · non-contrast
Comparison: 10/22/2018

CLINICAL DATA: Heart palpitations and weakness.

EXAM:
CHEST - 2 VIEW

[w chest pa]
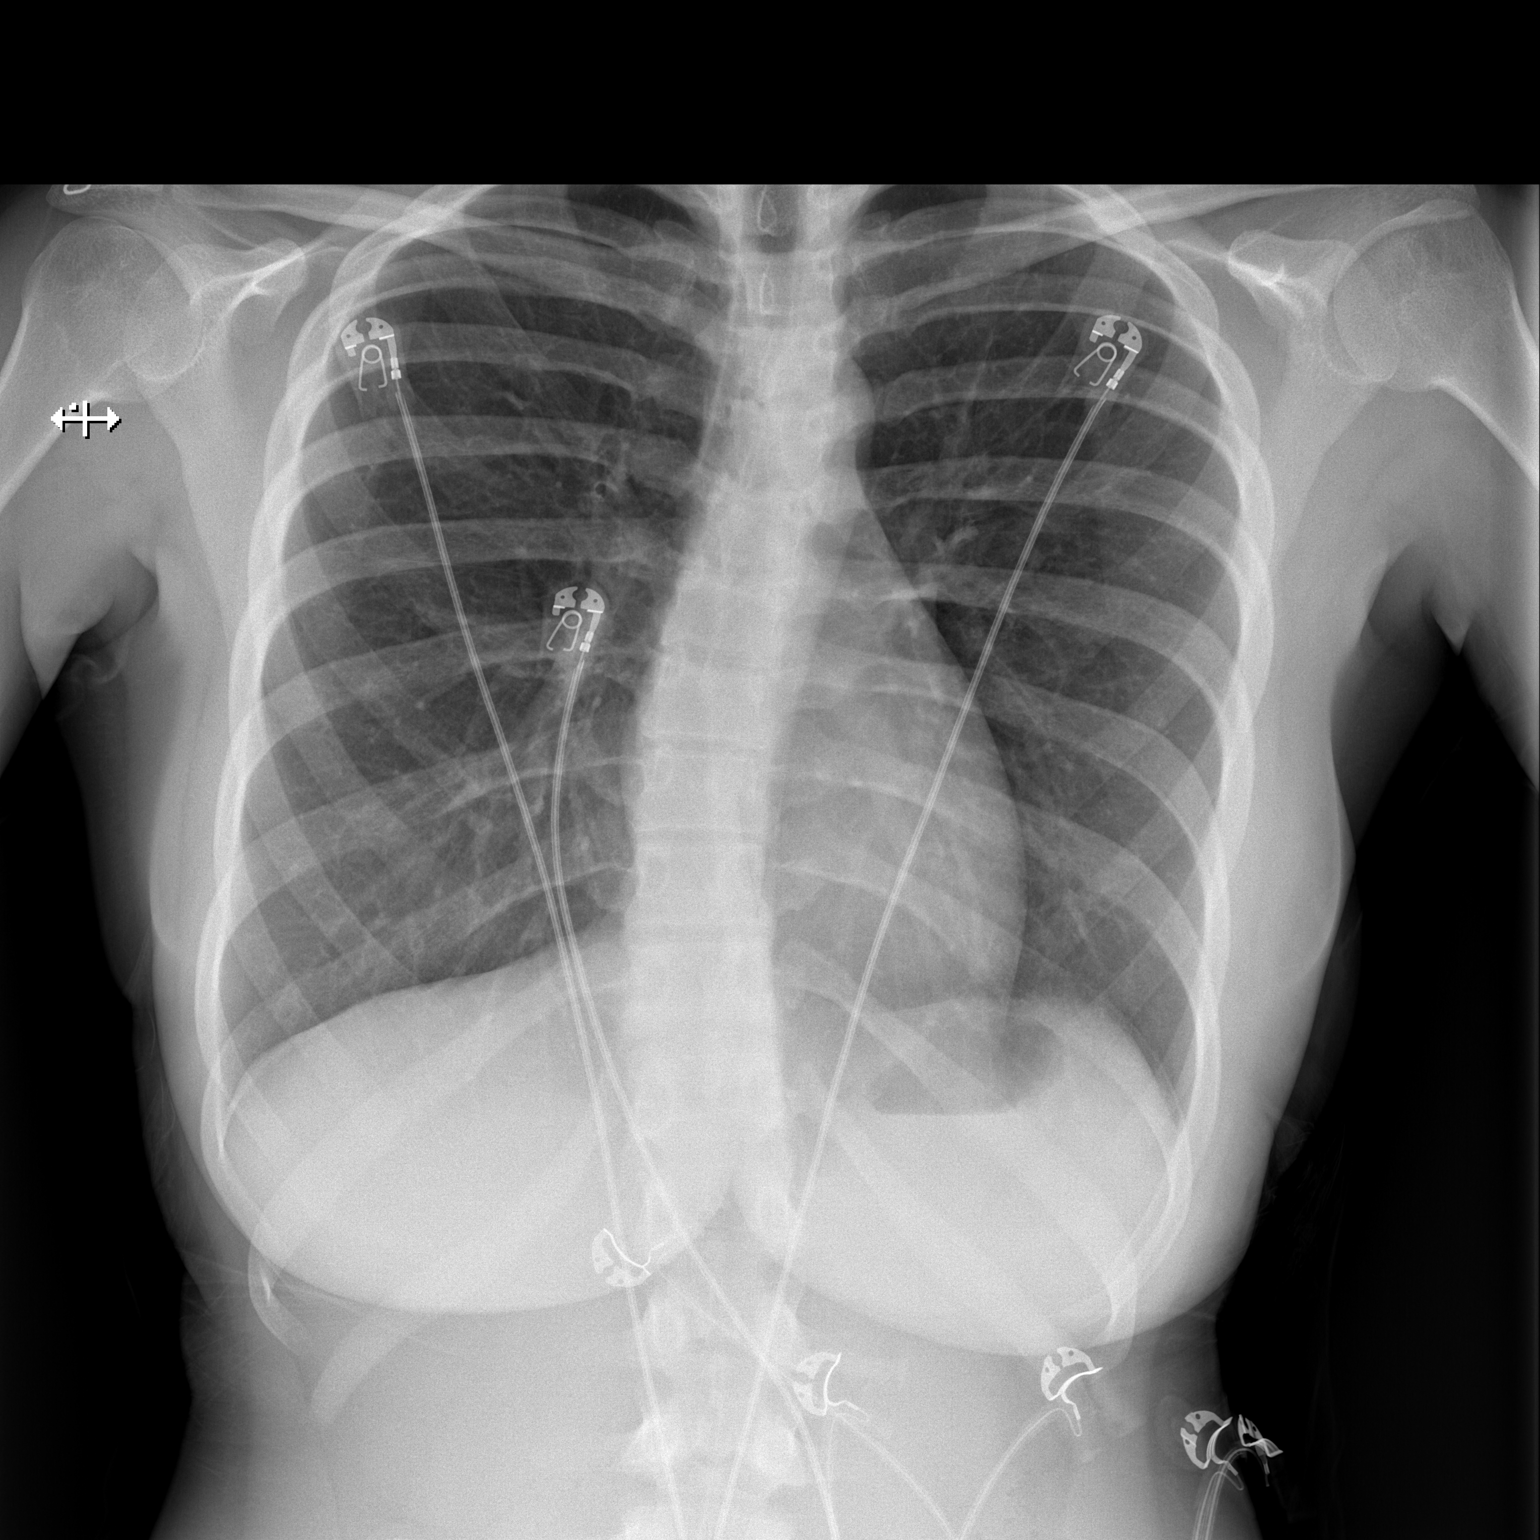

[w chest lat]
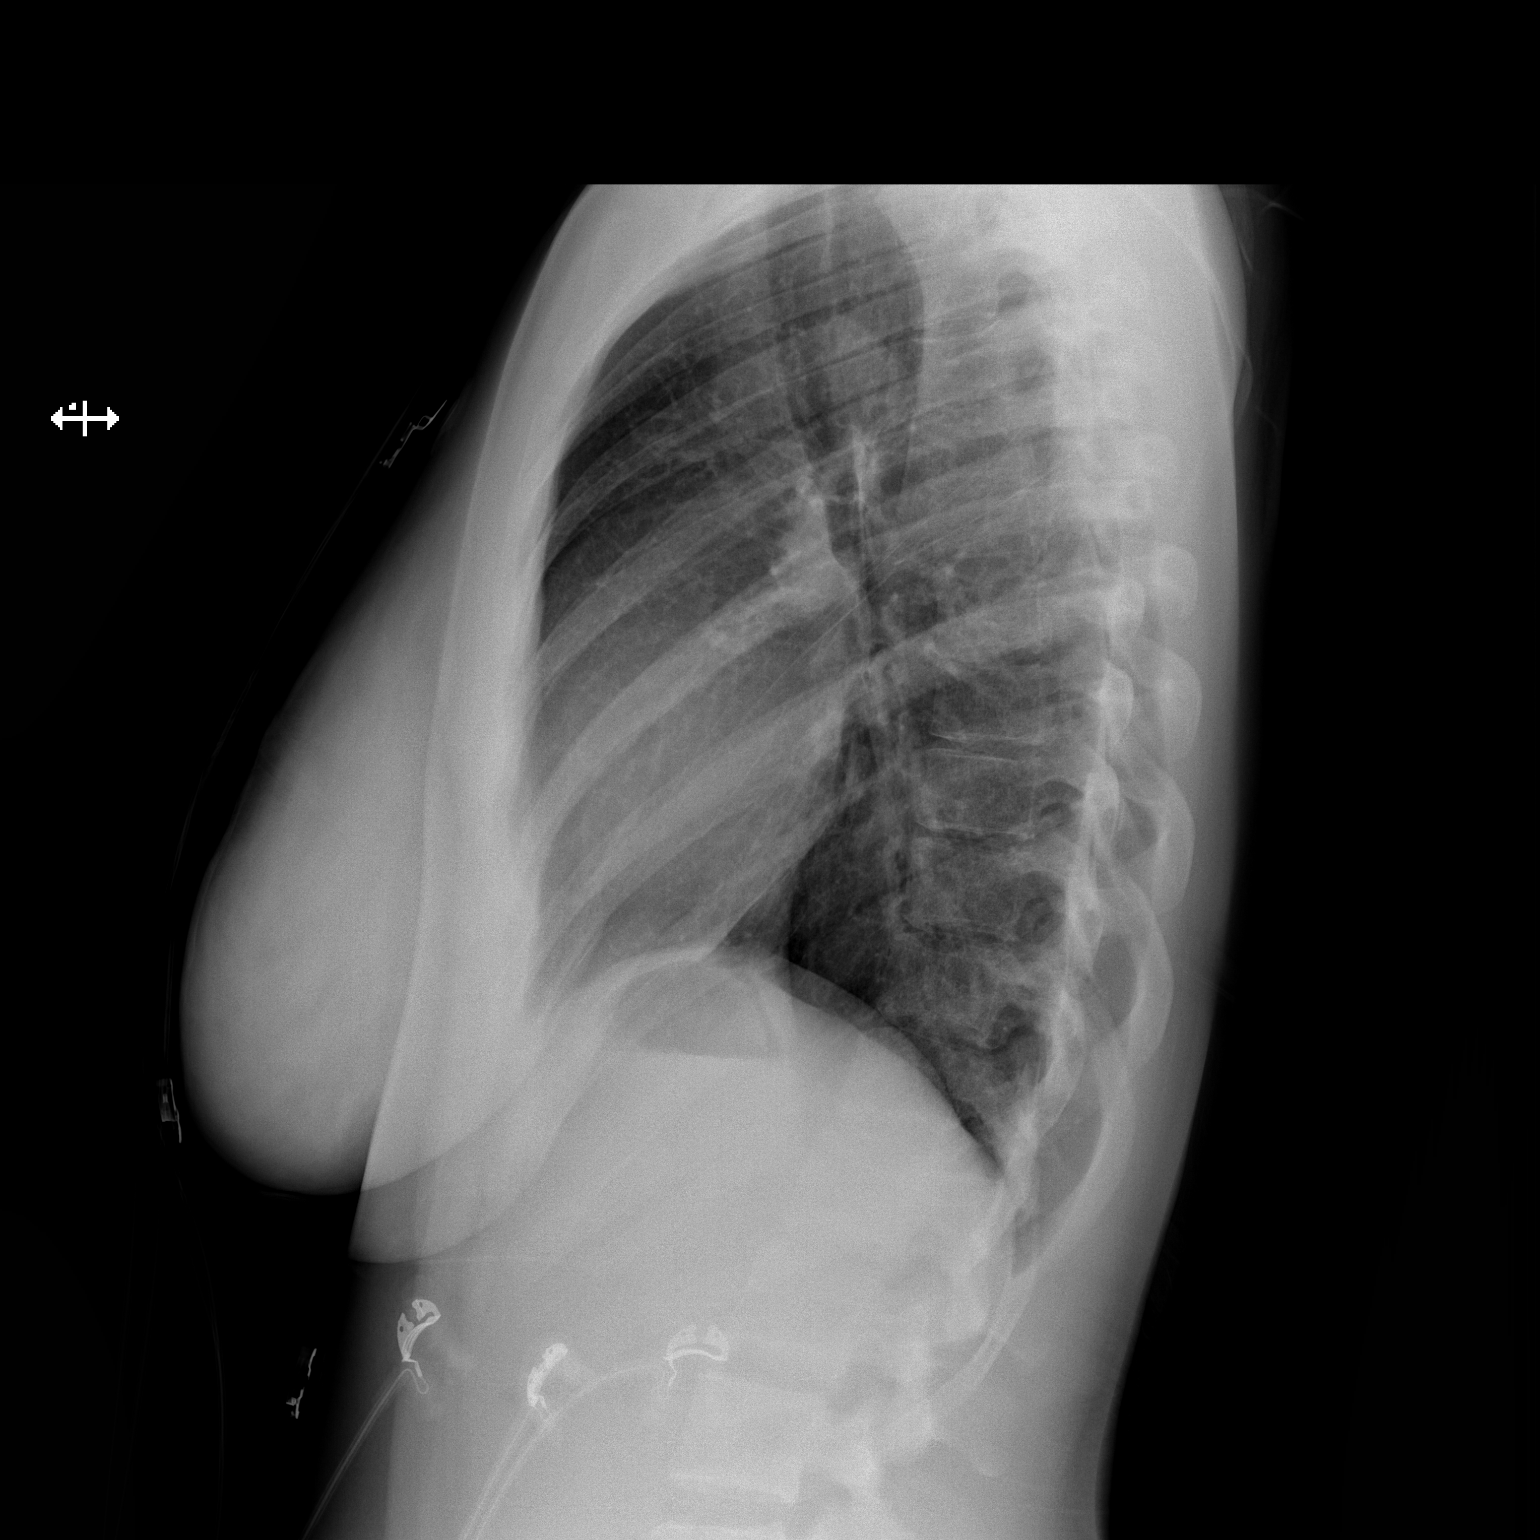

[2 of 2 positions shown; findings below may reference images not displayed]

FINDINGS: The heart size and mediastinal contours are within normal limits.
Both lungs are clear. The visualized skeletal structures are
unremarkable.
IMPRESSION: No active cardiopulmonary disease.

## 2020-02-07 ENCOUNTER — Other Ambulatory Visit: Payer: Self-pay | Admitting: Physician Assistant

## 2020-02-08 ENCOUNTER — Other Ambulatory Visit: Payer: Self-pay | Admitting: Physician Assistant

## 2020-03-09 ENCOUNTER — Other Ambulatory Visit: Payer: Self-pay | Admitting: Physician Assistant

## 2020-05-13 ENCOUNTER — Other Ambulatory Visit: Payer: Self-pay | Admitting: Physician Assistant

## 2020-06-04 ENCOUNTER — Telehealth: Payer: Self-pay | Admitting: Physician Assistant

## 2020-06-04 NOTE — Telephone Encounter (Signed)
LVM for patient to call back to give [place] the fax number we need to fax her paperwork.  Paperwork is under W

## 2020-06-16 ENCOUNTER — Other Ambulatory Visit: Payer: Self-pay | Admitting: Physician Assistant

## 2020-07-24 ENCOUNTER — Other Ambulatory Visit: Payer: Self-pay | Admitting: Physician Assistant

## 2020-08-18 ENCOUNTER — Other Ambulatory Visit: Payer: Self-pay | Admitting: Physician Assistant

## 2020-09-13 ENCOUNTER — Other Ambulatory Visit: Payer: Self-pay | Admitting: Physician Assistant

## 2020-10-12 ENCOUNTER — Other Ambulatory Visit: Payer: Self-pay | Admitting: Physician Assistant

## 2020-10-22 ENCOUNTER — Other Ambulatory Visit: Payer: Self-pay | Admitting: Physician Assistant

## 2020-11-12 ENCOUNTER — Other Ambulatory Visit: Payer: Self-pay | Admitting: Physician Assistant

## 2020-11-16 ENCOUNTER — Other Ambulatory Visit: Payer: Self-pay | Admitting: Physician Assistant

## 2020-12-16 ENCOUNTER — Other Ambulatory Visit: Payer: Self-pay | Admitting: Physician Assistant

## 2021-01-15 ENCOUNTER — Other Ambulatory Visit: Payer: Self-pay | Admitting: Physician Assistant

## 2021-02-06 ENCOUNTER — Other Ambulatory Visit: Payer: Self-pay | Admitting: Physician Assistant

## 2021-02-16 ENCOUNTER — Other Ambulatory Visit: Payer: Self-pay | Admitting: Physician Assistant

## 2021-03-14 ENCOUNTER — Other Ambulatory Visit: Payer: Self-pay | Admitting: Physician Assistant

## 2021-07-01 ENCOUNTER — Other Ambulatory Visit: Payer: Self-pay | Admitting: Physician Assistant

## 2021-07-04 ENCOUNTER — Telehealth: Payer: Self-pay

## 2021-07-04 NOTE — Telephone Encounter (Signed)
  LAST APPOINTMENT DATE: 07/01/2021   NEXT APPOINTMENT DATE:@Visit  date not found  MEDICATION:  PHARMACY:  Let patient know to contact pharmacy at the end of the day to make sure medication is ready.  Please notify patient to allow 48-72 hours to process  Encourage patient to contact the pharmacy for refills or they can request refills through [facility]  CLINICAL FILLS OUT ALL BELOW:   LAST REFILL:  QTY:  REFILL DATE:    OTHER COMMENTS:    Okay for refill?  Please advise

## 2021-07-04 NOTE — Telephone Encounter (Signed)
Error

## 2021-08-04 ENCOUNTER — Other Ambulatory Visit: Payer: Self-pay | Admitting: Physician Assistant

## 2021-08-10 ENCOUNTER — Telehealth: Payer: Self-pay

## 2021-08-10 NOTE — Telephone Encounter (Signed)
  Encourage patient to contact the pharmacy for refills or they can request refills through [facility]  LAST APPOINTMENT DATE:  10/15/20  NEXT APPOINTMENT DATE:  MEDICATION: sertraline (ZOLOFT) 25 MG tablet  Is the patient out of medication? yes  PHARMACY:[facility] [phone] - [address] [place], [place] - [address] [place] AT [facility] [address]  COMMENTS: Patient is establishing with a new PCP on Monday and is out of medication, wondering if we can send a short supply to last until her appointment.   Let patient know to contact pharmacy at the end of the day to make sure medication is ready.  Please notify patient to allow 48-72 hours to process

## 2021-08-10 NOTE — Telephone Encounter (Signed)
Unable to LVM. Will try to call again later.

## 2021-08-10 NOTE — Telephone Encounter (Signed)
Please call and tell her we can not do refill. Pt has not been seen by our providers since 09/2019. Sorry

## 2021-08-11 NOTE — Telephone Encounter (Signed)
Patient is aware of message below.

## 2021-12-03 ENCOUNTER — Other Ambulatory Visit: Payer: Self-pay | Admitting: Physician Assistant
